# Patient Record
Sex: Female | Born: 1970 | Race: White | Hispanic: No | Marital: Married | Smoking: Never smoker
Health system: Southern US, Community
[De-identification: ages and names within clinical notes are randomized; demographics above are authoritative.]

## PROBLEM LIST (undated history)

## (undated) DIAGNOSIS — Z9889 Other specified postprocedural states: Secondary | ICD-10-CM

## (undated) DIAGNOSIS — N63 Unspecified lump in unspecified breast: Secondary | ICD-10-CM

## (undated) DIAGNOSIS — Z801 Family history of malignant neoplasm of trachea, bronchus and lung: Secondary | ICD-10-CM

## (undated) DIAGNOSIS — C801 Malignant (primary) neoplasm, unspecified: Secondary | ICD-10-CM

## (undated) DIAGNOSIS — R112 Nausea with vomiting, unspecified: Secondary | ICD-10-CM

## (undated) DIAGNOSIS — Z808 Family history of malignant neoplasm of other organs or systems: Secondary | ICD-10-CM

## (undated) DIAGNOSIS — M1990 Unspecified osteoarthritis, unspecified site: Secondary | ICD-10-CM

## (undated) DIAGNOSIS — M5136 Other intervertebral disc degeneration, lumbar region: Secondary | ICD-10-CM

## (undated) DIAGNOSIS — D649 Anemia, unspecified: Secondary | ICD-10-CM

## (undated) DIAGNOSIS — Z8052 Family history of malignant neoplasm of bladder: Secondary | ICD-10-CM

## (undated) HISTORY — DX: Family history of malignant neoplasm of other organs or systems: Z80.8

## (undated) HISTORY — PX: OTHER SURGICAL HISTORY: SHX169

## (undated) HISTORY — DX: Family history of malignant neoplasm of trachea, bronchus and lung: Z80.1

## (undated) HISTORY — PX: TONSILLECTOMY: SUR1361

## (undated) HISTORY — PX: BREAST SURGERY: SHX581

## (undated) HISTORY — DX: Family history of malignant neoplasm of bladder: Z80.52

---

## 2001-04-09 ENCOUNTER — Other Ambulatory Visit: Admission: RE | Admit: 2001-04-09 | Discharge: 2001-04-09 | Payer: Self-pay | Admitting: Gynecology

## 2002-05-31 ENCOUNTER — Other Ambulatory Visit: Admission: RE | Admit: 2002-05-31 | Discharge: 2002-05-31 | Payer: Self-pay | Admitting: Gynecology

## 2004-12-03 ENCOUNTER — Other Ambulatory Visit: Admission: RE | Admit: 2004-12-03 | Discharge: 2004-12-03 | Payer: Self-pay | Admitting: Obstetrics and Gynecology

## 2004-12-06 ENCOUNTER — Encounter: Admission: RE | Admit: 2004-12-06 | Discharge: 2004-12-06 | Payer: Self-pay | Admitting: Obstetrics and Gynecology

## 2008-02-10 ENCOUNTER — Encounter: Admission: RE | Admit: 2008-02-10 | Discharge: 2008-02-10 | Payer: Self-pay | Admitting: Obstetrics and Gynecology

## 2010-12-10 ENCOUNTER — Other Ambulatory Visit: Payer: Self-pay | Admitting: Obstetrics and Gynecology

## 2010-12-10 DIAGNOSIS — N644 Mastodynia: Secondary | ICD-10-CM

## 2014-01-12 ENCOUNTER — Ambulatory Visit: Payer: Self-pay | Admitting: Podiatry

## 2014-01-14 ENCOUNTER — Ambulatory Visit (INDEPENDENT_AMBULATORY_CARE_PROVIDER_SITE_OTHER): Payer: BLUE CROSS/BLUE SHIELD | Admitting: Podiatry

## 2014-01-14 ENCOUNTER — Encounter: Payer: Self-pay | Admitting: Podiatry

## 2014-01-14 ENCOUNTER — Ambulatory Visit (INDEPENDENT_AMBULATORY_CARE_PROVIDER_SITE_OTHER): Payer: BLUE CROSS/BLUE SHIELD

## 2014-01-14 VITALS — BP 106/70 | HR 67 | Resp 18

## 2014-01-14 DIAGNOSIS — M7661 Achilles tendinitis, right leg: Secondary | ICD-10-CM

## 2014-01-14 DIAGNOSIS — M722 Plantar fascial fibromatosis: Secondary | ICD-10-CM

## 2014-01-14 MED ORDER — TRIAMCINOLONE ACETONIDE 10 MG/ML IJ SUSP
10.0000 mg | Freq: Once | INTRAMUSCULAR | Status: AC
  Administered 2014-01-14: 10 mg

## 2014-01-14 MED ORDER — MELOXICAM 7.5 MG PO TABS: 7.5000 mg | ORAL_TABLET | Freq: Every day | ORAL | Status: DC

## 2014-01-14 NOTE — Progress Notes (Signed)
   Subjective:    Patient ID: Sabrina Spencer, female    DOB: September 12, 1970, 44 y.o.   MRN: [id number]  HPI  44 year old female presents to the office for complaints of right heel pain, which has been ongoing for approximately 1 month. She does not relate any recent injury or trauma to the area. She denies any change in activity recently. She has been rolling her foot with a frozen water bottle, which states it helps. She states it hurts mostly in the morning, or after periods of activity and after prolonged standing/activity. Denies any overlying erythema or swelling. Denies any burning or numbness. No other complaints at this time.    Review of Systems  All other systems reviewed and are negative.      Objective:   Physical Exam AAO x3, NAD DP/PT pulses palpable bilaterally, CRT less than 3 seconds Protective sensation intact with Simms Weinstein monofilament, vibratory sensation intact, Achilles tendon reflex intact Tenderness to palpation upon the plantar medial tubercle of the calcaneus at the insertion of the plantar fascia on the right foot. There is no pain along the course of the plantar fascia within the arch of the foot and the plantar fascia appears to be intact. There is no pain with lateral compression of the calcaneus or pain with vibratory sensation. There is mild discomfort along the insertion of the Achilles tendon on the right foot. There is no pain on the midsubstance of the Achilles tendon and the tendon appears to be intact. No other areas of pinpoint bony tenderness or pain with vibratory sensation to bilateral lower extremity's. No overlying edema, erythema, increase in warmth bilaterally. MMT 5/5, ROM WNL No open lesions or pre-ulcerative lesions are identified. No pain with calf compression, swelling, warmth, erythema.       Assessment & Plan:  44 year old female with right heel pain, likely plantar fasciitis; insertional Achilles tendinitis -X-rays were obtained and  reviewed the patient. -Treatment options were discussed including alternatives, risks, complications. -Patient elects to proceed with steroid injection into the right plantar heel. Under sterile skin preparation, a total of 2.5cc of kenalog 10, 0.5% Marcaine plain, and 2% lidocaine plain were infiltrated into the symptomatic area without complication. A band-aid was applied. Patient tolerated the injection well without complication. Post-injection care with discussed with the patient. Discussed with the patient to ice the area over the next couple of days to help prevent a steroid flare.  -Prescribed meloxicam. Discussed side effects the medication and directed to stop immediately should any occur and call the office. -Dispensed plantar fascial brace. -Discussed stretching exercises. -Ice to the area. -Discussed shoe gear modifications and possible orthotics. -Follow-up in 3 weeks or sooner should any problems arise. In the meantime, encouraged all the office with any questions, concerns, change in symptoms.

## 2014-01-14 NOTE — Patient Instructions (Signed)
Plantar Fasciitis (Heel Spur Syndrome) with Rehab The plantar fascia is a fibrous, ligament-like, soft-tissue structure that spans the bottom of the foot. Plantar fasciitis is a condition that causes pain in the foot due to inflammation of the tissue. SYMPTOMS   Pain and tenderness on the underneath side of the foot.  Pain that worsens with standing or walking. CAUSES  Plantar fasciitis is caused by irritation and injury to the plantar fascia on the underneath side of the foot. Common mechanisms of injury include:  Direct trauma to bottom of the foot.  Damage to a small nerve that runs under the foot where the main fascia attaches to the heel bone.  Stress placed on the plantar fascia due to bone spurs. RISK INCREASES WITH:   Activities that place stress on the plantar fascia (running, jumping, pivoting, or cutting).  Poor strength and flexibility.  Improperly fitted shoes.  Tight calf muscles.  Flat feet.  Failure to warm-up properly before activity.  Obesity. PREVENTION  Warm up and stretch properly before activity.  Allow for adequate recovery between workouts.  Maintain physical fitness:  Strength, flexibility, and endurance.  Cardiovascular fitness.  Maintain a health body weight.  Avoid stress on the plantar fascia.  Wear properly fitted shoes, including arch supports for individuals who have flat feet. PROGNOSIS  If treated properly, then the symptoms of plantar fasciitis usually resolve without surgery. However, occasionally surgery is necessary. RELATED COMPLICATIONS   Recurrent symptoms that may result in a chronic condition.  Problems of the lower back that are caused by compensating for the injury, such as limping.  Pain or weakness of the foot during push-off following surgery.  Chronic inflammation, scarring, and partial or complete fascia tear, occurring more often from repeated injections. TREATMENT  Treatment initially involves the use of  ice and medication to help reduce pain and inflammation. The use of strengthening and stretching exercises may help reduce pain with activity, especially stretches of the Achilles tendon. These exercises may be performed at home or with a therapist. Your caregiver may recommend that you use heel cups of arch supports to help reduce stress on the plantar fascia. Occasionally, corticosteroid injections are given to reduce inflammation. If symptoms persist for greater than 6 months despite non-surgical (conservative), then surgery may be recommended.  MEDICATION   If pain medication is necessary, then nonsteroidal anti-inflammatory medications, such as aspirin and ibuprofen, or other minor pain relievers, such as acetaminophen, are often recommended.  Do not take pain medication within 7 days before surgery.  Prescription pain relievers may be given if deemed necessary by your caregiver. Use only as directed and only as much as you need.  Corticosteroid injections may be given by your caregiver. These injections should be reserved for the most serious cases, because they may only be given a certain number of times. HEAT AND COLD  Cold treatment (icing) relieves pain and reduces inflammation. Cold treatment should be applied for 10 to 15 minutes every 2 to 3 hours for inflammation and pain and immediately after any activity that aggravates your symptoms. Use ice packs or massage the area with a piece of ice (ice massage).  Heat treatment may be used prior to performing the stretching and strengthening activities prescribed by your caregiver, physical therapist, or athletic trainer. Use a heat pack or soak the injury in warm water. SEEK IMMEDIATE MEDICAL CARE IF:  Treatment seems to offer no benefit, or the condition worsens.  Any medications produce adverse side effects. EXERCISES RANGE   OF MOTION (ROM) AND STRETCHING EXERCISES - Plantar Fasciitis (Heel Spur Syndrome) These exercises may help you  when beginning to rehabilitate your injury. Your symptoms may resolve with or without further involvement from your physician, physical therapist or athletic trainer. While completing these exercises, remember:   Restoring tissue flexibility helps normal motion to return to the joints. This allows healthier, less painful movement and activity.  An effective stretch should be held for at least 30 seconds.  A stretch should never be painful. You should only feel a gentle lengthening or release in the stretched tissue. RANGE OF MOTION - Toe Extension, Flexion  Sit with your right / left leg crossed over your opposite knee.  Grasp your toes and gently pull them back toward the top of your foot. You should feel a stretch on the bottom of your toes and/or foot.  Hold this stretch for __________ seconds.  Now, gently pull your toes toward the bottom of your foot. You should feel a stretch on the top of your toes and or foot.  Hold this stretch for __________ seconds. Repeat __________ times. Complete this stretch __________ times per day.  RANGE OF MOTION - Ankle Dorsiflexion, Active Assisted  Remove shoes and sit on a chair that is preferably not on a carpeted surface.  Place right / left foot under knee. Extend your opposite leg for support.  Keeping your heel down, slide your right / left foot back toward the chair until you feel a stretch at your ankle or calf. If you do not feel a stretch, slide your bottom forward to the edge of the chair, while still keeping your heel down.  Hold this stretch for __________ seconds. Repeat __________ times. Complete this stretch __________ times per day.  STRETCH - Gastroc, Standing  Place hands on wall.  Extend right / left leg, keeping the front knee somewhat bent.  Slightly point your toes inward on your back foot.  Keeping your right / left heel on the floor and your knee straight, shift your weight toward the wall, not allowing your back to  arch.  You should feel a gentle stretch in the right / left calf. Hold this position for __________ seconds. Repeat __________ times. Complete this stretch __________ times per day. STRETCH - Soleus, Standing  Place hands on wall.  Extend right / left leg, keeping the other knee somewhat bent.  Slightly point your toes inward on your back foot.  Keep your right / left heel on the floor, bend your back knee, and slightly shift your weight over the back leg so that you feel a gentle stretch deep in your back calf.  Hold this position for __________ seconds. Repeat __________ times. Complete this stretch __________ times per day. STRETCH - Gastrocsoleus, Standing  Note: This exercise can place a lot of stress on your foot and ankle. Please complete this exercise only if specifically instructed by your caregiver.   Place the ball of your right / left foot on a step, keeping your other foot firmly on the same step.  Hold on to the wall or a rail for balance.  Slowly lift your other foot, allowing your body weight to press your heel down over the edge of the step.  You should feel a stretch in your right / left calf.  Hold this position for __________ seconds.  Repeat this exercise with a slight bend in your right / left knee. Repeat __________ times. Complete this stretch __________ times per day.    STRENGTHENING EXERCISES - Plantar Fasciitis (Heel Spur Syndrome)  These exercises may help you when beginning to rehabilitate your injury. They may resolve your symptoms with or without further involvement from your physician, physical therapist or athletic trainer. While completing these exercises, remember:   Muscles can gain both the endurance and the strength needed for everyday activities through controlled exercises.  Complete these exercises as instructed by your physician, physical therapist or athletic trainer. Progress the resistance and repetitions only as guided. STRENGTH -  Towel Curls  Sit in a chair positioned on a non-carpeted surface.  Place your foot on a towel, keeping your heel on the floor.  Pull the towel toward your heel by only curling your toes. Keep your heel on the floor.  If instructed by your physician, physical therapist or athletic trainer, add ____________________ at the end of the towel. Repeat __________ times. Complete this exercise __________ times per day. STRENGTH - Ankle Inversion  Secure one end of a rubber exercise band/tubing to a fixed object (table, pole). Loop the other end around your foot just before your toes.  Place your fists between your knees. This will focus your strengthening at your ankle.  Slowly, pull your big toe up and in, making sure the band/tubing is positioned to resist the entire motion.  Hold this position for __________ seconds.  Have your muscles resist the band/tubing as it slowly pulls your foot back to the starting position. Repeat __________ times. Complete this exercises __________ times per day.  Document Released: 12/23/2004 Document Revised: 03/17/2011 Document Reviewed: 04/06/2008 [employer] Patient Information 2015 [employer]. This information is not intended to replace advice given to you by your health care provider. Make sure you discuss any questions you have with your health care provider.  

## 2014-02-10 ENCOUNTER — Ambulatory Visit (INDEPENDENT_AMBULATORY_CARE_PROVIDER_SITE_OTHER): Payer: BLUE CROSS/BLUE SHIELD | Admitting: Podiatry

## 2014-02-10 VITALS — BP 110/74 | HR 71 | Resp 16

## 2014-02-10 DIAGNOSIS — M722 Plantar fascial fibromatosis: Secondary | ICD-10-CM

## 2014-02-13 NOTE — Progress Notes (Signed)
Patient ID: Sabrina Spencer, female   DOB: Jan 18, 1970, 44 y.o.   MRN: [id number]  Subjective: 44 year old female returns the office they for follow-up evaluation of right heel pain, plantar fasciitis. She states that since last appointment she no longer has any discomfort to the heel. She is inquiring she can go back to starting her exercise. She's been continuing the stretching icing exercises.  She is able to ambulate in a regular shoe without any difficulty. Denies any recent swelling or redness overlying the area. No other complaints at this time.  Objective: AAO x3, NAD DP/PT pulses palpable bilaterally, CRT less than 3 seconds Protective sensation intact with Simms Weinstein monofilament, vibratory sensation intact, Achilles tendon reflex intact There is currently no tenderness to palpation overlying the plantar medial tubercle of the calcaneus on the right heel at the insertion of the plantar fascia. There is no pain along the course of plantar fascial within the arch of the foot. There is no pain with lateral compression of the calcaneus or pain the vibratory sensation. No pain on the posterior aspect of the calcaneus or along the course/insertion of the Achilles tendon. There is no overlying edema, erythema, increase in warmth. No other areas of tenderness palpation or pain with vibratory sensation to the foot/ankle bilaterally. MMT 5/5, ROM WNL No open lesions or pre-ulcerative lesions are identified. No pain with calf compression, swelling, warmth, erythema.  Assessment: 44 year old female with resolving right heel pain, likely plantar fasciitis  Plan: -At this time as symptoms appear to be resolved she can start to slowly increase her activity. Discussed with her to continue the stretching icing activities before and after exercising and to slowly go back into her regular routine. Discomfort is are to avoid high-impact activities.  Discussed the importance of wearing supportive shoe gear and  discussed possible orthotics for long-term treatment to help prevent recurrence. Should there be any recurrence of symptoms to call the office. Otherwise, follow-up as needed.

## 2015-02-16 ENCOUNTER — Ambulatory Visit: Payer: Self-pay | Admitting: Podiatry

## 2017-07-24 ENCOUNTER — Other Ambulatory Visit: Payer: Self-pay | Admitting: Obstetrics and Gynecology

## 2017-07-24 DIAGNOSIS — N63 Unspecified lump in unspecified breast: Secondary | ICD-10-CM

## 2017-07-27 ENCOUNTER — Ambulatory Visit
Admission: RE | Admit: 2017-07-27 | Discharge: 2017-07-27 | Disposition: A | Discharge status: Home / Self Care | Payer: BLUE CROSS/BLUE SHIELD | Source: Ambulatory Visit | Attending: Obstetrics and Gynecology | Admitting: Obstetrics and Gynecology

## 2017-07-27 ENCOUNTER — Other Ambulatory Visit: Payer: Self-pay | Admitting: Obstetrics and Gynecology

## 2017-07-27 DIAGNOSIS — N63 Unspecified lump in unspecified breast: Secondary | ICD-10-CM

## 2017-07-27 HISTORY — DX: Unspecified lump in unspecified breast: N63.0

## 2017-07-30 ENCOUNTER — Ambulatory Visit
Admission: RE | Admit: 2017-07-30 | Discharge: 2017-07-30 | Disposition: A | Discharge status: Home / Self Care | Payer: BLUE CROSS/BLUE SHIELD | Source: Ambulatory Visit | Attending: Obstetrics and Gynecology | Admitting: Obstetrics and Gynecology

## 2017-07-30 ENCOUNTER — Other Ambulatory Visit: Payer: Self-pay | Admitting: Obstetrics and Gynecology

## 2017-07-30 DIAGNOSIS — N63 Unspecified lump in unspecified breast: Secondary | ICD-10-CM

## 2017-07-31 ENCOUNTER — Encounter (HOSPITAL_COMMUNITY): Payer: Self-pay | Admitting: Emergency Medicine

## 2017-07-31 ENCOUNTER — Emergency Department (HOSPITAL_COMMUNITY)
Admission: EM | Admit: 2017-07-31 | Discharge: 2017-07-31 | Disposition: A | Discharge status: Home / Self Care | Payer: BLUE CROSS/BLUE SHIELD | Attending: Emergency Medicine | Admitting: Emergency Medicine

## 2017-07-31 ENCOUNTER — Other Ambulatory Visit: Payer: Self-pay

## 2017-07-31 ENCOUNTER — Emergency Department (HOSPITAL_COMMUNITY): Payer: BLUE CROSS/BLUE SHIELD

## 2017-07-31 DIAGNOSIS — R0602 Shortness of breath: Secondary | ICD-10-CM | POA: Insufficient documentation

## 2017-07-31 DIAGNOSIS — R110 Nausea: Secondary | ICD-10-CM | POA: Insufficient documentation

## 2017-07-31 DIAGNOSIS — R61 Generalized hyperhidrosis: Secondary | ICD-10-CM | POA: Diagnosis not present

## 2017-07-31 DIAGNOSIS — C50911 Malignant neoplasm of unspecified site of right female breast: Secondary | ICD-10-CM | POA: Diagnosis not present

## 2017-07-31 DIAGNOSIS — R0789 Other chest pain: Secondary | ICD-10-CM | POA: Diagnosis not present

## 2017-07-31 HISTORY — DX: Malignant (primary) neoplasm, unspecified: C80.1

## 2017-07-31 LAB — CBC WITH DIFFERENTIAL/PLATELET
Abs Immature Granulocytes: 0 10*3/uL (ref 0.0–0.1)
Basophils Absolute: 0.1 10*3/uL (ref 0.0–0.1)
Basophils Relative: 1 %
Eosinophils Absolute: 0.1 10*3/uL (ref 0.0–0.7)
Eosinophils Relative: 2 %
HCT: 43.1 % (ref 36.0–46.0)
Hemoglobin: 14.2 g/dL (ref 12.0–15.0)
Immature Granulocytes: 0 %
Lymphocytes Relative: 36 %
Lymphs Abs: 1.9 10*3/uL (ref 0.7–4.0)
MCH: 32.3 pg (ref 26.0–34.0)
MCHC: 32.9 g/dL (ref 30.0–36.0)
MCV: 98.2 fL (ref 78.0–100.0)
Monocytes Absolute: 0.5 10*3/uL (ref 0.1–1.0)
Monocytes Relative: 10 %
Neutro Abs: 2.7 10*3/uL (ref 1.7–7.7)
Neutrophils Relative %: 51 %
Platelets: 217 10*3/uL (ref 150–400)
RBC: 4.39 MIL/uL (ref 3.87–5.11)
RDW: 12.3 % (ref 11.5–15.5)
WBC: 5.4 10*3/uL (ref 4.0–10.5)

## 2017-07-31 LAB — BASIC METABOLIC PANEL
Anion gap: 9 (ref 5–15)
BUN: 15 mg/dL (ref 6–20)
CO2: 22 mmol/L (ref 22–32)
Calcium: 9.1 mg/dL (ref 8.9–10.3)
Chloride: 108 mmol/L (ref 98–111)
Creatinine, Ser: 0.79 mg/dL (ref 0.44–1.00)
GFR calc Af Amer: >60 mL/min (ref >60)
GFR calc non Af Amer: >60 mL/min (ref >60)
Glucose, Bld: 83 mg/dL (ref 70–99)
Potassium: 4.4 mmol/L (ref 3.5–5.1)
Sodium: 139 mmol/L (ref 135–145)

## 2017-07-31 LAB — D-DIMER, QUANTITATIVE: D-Dimer, Quant: 0.42 ug/mL-FEU (ref 0.00–0.50)

## 2017-07-31 LAB — I-STAT TROPONIN, ED
Troponin i, poc: 0 ng/mL (ref 0.00–0.08)
Troponin i, poc: 0 ng/mL (ref 0.00–0.08)

## 2017-07-31 NOTE — ED Provider Notes (Signed)
[facility] DEPART[facility] Provider Note   CSN: [id number] Arrival date & time: 07/31/17  1435     History   Chief Complaint Chief Complaint  Patient presents with  . Chest Pain    HPI Sabrina Spencer is a 47 y.o. female , who was diagnosed yesterday with cancer of the right breast, who presents to ED for evaluation of 2-week history of intermittent right-sided chest pain radiating to the right neck with nausea, shortness of breath, fatigue and diaphoresis.  States that the entire right side of her body feels different.  She initially began feeling like her breasts were larger.  She noticed that the right breast was significantly larger than the left and when she did a self-exam, she noticed a mass/lump in the area.  Biopsy was done yesterday and she was called today informing that results of cancer.  She had chest pain yesterday and her OB/GYN's office and they insisted on her coming to the ED.  However, patient did not want to because she states that "I just wanted to handle one thing at a time."  They told her that the chest pain was not related to her cancer and that it was cardiac related.  She returns today because it worsened in the past 3 to 4 days.  She is meeting in the next few days to weeks for further management of her newly diagnosed breast cancer.  Reports chronic cough that is nonproductive.  Denies any hemoptysis, history of MI, DVT or PE, leg swelling, recent surgeries, recent prolonged travel, tobacco use, family history of heart disease, fever, abdominal pain, vomiting.  HPI  Past Medical History:  Diagnosis Date  . Breast mass   . Cancer ([place])     There are no active problems to display for this patient.   History reviewed. No pertinent surgical history.   OB History   None      Home Medications    Prior to Admission medications   Medication Sig Start Date End Date Taking? Authorizing Provider  lamoTRIgine (LAMICTAL) 200 MG tablet   12/18/13   [provider]  meloxicam (MOBIC) 7.5 MG tablet Take 1 tablet (7.5 mg total) by mouth daily. Patient not taking: Reported on 02/10/2014 01/14/14   Trula Slade, DPM  risperiDONE (RISPERDAL) 3 MG tablet  12/25/13   [provider]  zaleplon (SONATA) 10 MG capsule  11/18/13   [provider]    Family History History reviewed. No pertinent family history.  Social History Social History   Tobacco Use  . Smoking status: Never Smoker  . Smokeless tobacco: Never Used  Substance Use Topics  . Alcohol use: Yes    Alcohol/week: 0.0 oz    Comment: 4drinks/week  . Drug use: No     Allergies   Patient has no known allergies.   Review of Systems Review of Systems  Constitutional: Positive for diaphoresis. Negative for appetite change, chills and fever.  HENT: Negative for ear pain, rhinorrhea, sneezing and sore throat.   Eyes: Negative for photophobia and visual disturbance.  Respiratory: Positive for shortness of breath. Negative for cough, chest tightness and wheezing.   Cardiovascular: Positive for chest pain. Negative for palpitations.  Gastrointestinal: Positive for nausea. Negative for abdominal pain, blood in stool, constipation, diarrhea and vomiting.  Genitourinary: Negative for dysuria, hematuria and urgency.  Musculoskeletal: Negative for myalgias.  Skin: Negative for rash.  Neurological: Negative for dizziness, weakness and light-headedness.     Physical Exam  Updated Vital Signs BP (!) 109/97   Pulse 71   Temp 98.6 F (37 C) (Oral)   Resp 15   Ht '5\' 2"'$  (1.575 m)   Wt 67.1 kg (148 lb)   SpO2 100%   BMI 27.07 kg/m   Physical Exam  Constitutional: She appears well-developed and well-nourished. No distress.  HENT:  Head: Normocephalic and atraumatic.  Nose: Nose normal.  Eyes: Conjunctivae and EOM are normal. Left eye exhibits no discharge. No scleral icterus.  Neck: Normal range of motion. Neck supple.   Cardiovascular: Normal rate, regular rhythm, normal heart sounds and intact distal pulses. Exam reveals no gallop and no friction rub.  No murmur heard. Pulmonary/Chest: Effort normal and breath sounds normal. No respiratory distress. She exhibits tenderness.    Abdominal: Soft. Bowel sounds are normal. She exhibits no distension. There is no tenderness. There is no guarding.  Musculoskeletal: Normal range of motion. She exhibits no edema.  No lower extremity edema, erythema or calf tenderness bilaterally.  Neurological: She is alert. She exhibits normal muscle tone. Coordination normal.  Skin: Skin is warm and dry. No rash noted.  Psychiatric: She has a normal mood and affect.  Nursing note and vitals reviewed.    ED Treatments / Results  Labs (all labs ordered are listed, but only abnormal results are displayed) Labs Reviewed  CBC WITH DIFFERENTIAL/PLATELET  BASIC METABOLIC PANEL  D-DIMER, QUANTITATIVE (NOT AT [facility])  I-STAT TROPONIN, ED  I-STAT TROPONIN, ED    EKG EKG Interpretation  Date/Time:  Friday July 31 2017 16:30:06 EDT Ventricular Rate:  80 PR Interval:    QRS Duration: 84 QT Interval:  372 QTC Calculation: 430 R Axis:   64 Text Interpretation:  Sinus rhythm No old tracing to compare Confirmed by Duffy Bruce [phone]) on 07/31/2017 5:04:15 PM   Radiology Dg Chest 2 View  Result Date: 07/31/2017 CLINICAL DATA:  Patient status post axillary lymph node biopsy yesterday. Chest pain and shortness of breath since the procedure. EXAM: CHEST - 2 VIEW COMPARISON:  None. FINDINGS: The lungs are clear. Heart size is normal. No pneumothorax or pleural effusion. No bony abnormality. No radiopaque foreign body. IMPRESSION: Normal chest. Electronically Signed   By: Inge Rise M.D.   On: 07/31/2017 16:11   [place] Axillary Node Core Biopsy Right  Result Date: 07/30/2017 CLINICAL DATA:  Ultrasound-guided core needle biopsy was recommended of a right axillary lymph node  with mild/borderline cortical thickening. The patient has a suspicious right breast mass which was biopsied today. EXAM: [place] AXILLARY NODE CORE BIOPSY RIGHT COMPARISON:  Previous exam(s). FINDINGS: I met with the patient and we discussed the procedure of ultrasound-guided biopsy, including benefits and alternatives. We discussed the high likelihood of a successful procedure. We discussed the risks of the procedure, including infection, bleeding, tissue injury, clip migration, and inadequate sampling. Informed written consent was given. The usual time-out protocol was performed immediately prior to the procedure. Using sterile technique and 1% Lidocaine as local anesthetic, under direct ultrasound visualization, a 14 gauge spring-loaded device was used to perform biopsy of an inferior right axillary lymph node using a lateral approach. At the conclusion of the procedure a HydroMARK tissue marker clip was deployed into the biopsy cavity. Follow up 2 view mammogram was performed and dictated separately. IMPRESSION: Ultrasound guided biopsy of right axillary lymph node. No apparent complications. Electronically Signed   By: Curlene Dolphin M.D.   On: 07/30/2017 13:24   Mm Clip Placement Right  Result Date: 07/30/2017  CLINICAL DATA:  Ultrasound-guided biopsies of a right breast mass at 10 o'clock position and of a inferior right axillary lymph node were performed today. EXAM: DIAGNOSTIC RIGHT MAMMOGRAM POST ULTRASOUND BIOPSIES COMPARISON:  Previous exam(s). FINDINGS: Mammographic images were obtained following ultrasound guided biopsy of an upper-outer quadrant right breast mass and of a right axillary lymph node. Ribbon shaped biopsy clip is satisfactorily positioned within the biopsied mass. The mass is obscured on the 2D images by the patient's extremely dense breast parenchyma. HydroMARK biopsy clip projects over the inferior right axilla. IMPRESSION: Satisfactory position of ribbon shaped and HydroMARK biopsy  clips. Final Assessment: Post Procedure Mammograms for Marker Placement Electronically Signed   By: Curlene Dolphin M.D.   On: 07/30/2017 13:26   [place] Rt Breast Bx W Loc Dev 1st Lesion Img Bx Spec US Guide  Result Date: 07/30/2017 CLINICAL DATA:  47 year old patient presents for biopsy a suspicious palpable mass in the upper-outer quadrant of the right breast, 10 o'clock position. While with the patient today in the ultrasound room, she describes to me an approximately 2 week history intermittent shortness of breath, mild chest pain, fatigue, and just not feeling well. She tells me that she exercises regularly, but recently, she is unable to tolerate exercise that would typically be easy for her. EXAM: ULTRASOUND GUIDED LEFT BREAST CORE NEEDLE BIOPSY COMPARISON:  Previous exam(s). FINDINGS: I met with the patient and we discussed the procedure of ultrasound-guided biopsy, including benefits and alternatives. We discussed the high likelihood of a successful procedure. We discussed the risks of the procedure, including infection, bleeding, tissue injury, clip migration, and inadequate sampling. Informed written consent was given. The usual time-out protocol was performed immediately prior to the procedure. Lesion quadrant: Upper outer quadrant Using sterile technique and 1% Lidocaine as local anesthetic, under direct ultrasound visualization, a 12 gauge spring-loaded device was used to perform biopsy of a palpable hypoechoic mass at 10 o'clock position using a lateral approach. At the conclusion of the procedure a ribbon tissue marker clip was deployed into the biopsy cavity. Follow up 2 view mammogram was performed and dictated separately. IMPRESSION: Ultrasound guided biopsy of the right breast. No apparent complications. The patient describes new onset shortness of breath with mild activity, occasional chest pain, fatigue, and not feeling well. We took her blood pressure while she was in the office today, and it  was normal at 102/78. She was encouraged by myself and our nurse to seek evaluation in the emergency department. I spoke with the patient's nurse practitioner at [facility], Laurin Coder, who agrees that the patient should be seen at [facility] Emergency Department. She also asked me to tell the patient to stop her hormone replacement therapy. The patient tells me that she has not been taking it for approximately 1 week. Electronically Signed   By: Curlene Dolphin M.D.   On: 07/30/2017 13:24    Procedures Procedures (including critical care time)  Medications Ordered in ED Medications - No data to display   Initial Impression / Assessment and Plan / ED Course  I have reviewed the triage vital signs and the nursing notes.  Pertinent labs & imaging results that were available during my care of the patient were reviewed by me and considered in my medical decision making (see chart for details).     47 year old female that was recently diagnosed with cancer of right breast, presents for evaluation of intermittent right-sided chest pain radiating to her neck, shortness of breath, diaphoresis, nausea intermittently  for the past 2 weeks.  She was sent here by her OB/GYN to evaluate for cardiac etiology.  She was diagnosed with breast cancer today after biopsy was completed yesterday.  She initially attributed the symptoms to her cancer.  Denies any history of MI, PE, DVT, leg swelling.  On physical exam she is overall well-appearing.  Tenderness to palpation of the left side of the chest and reports "soreness" on the right side.  There is no lower extremity edema, erythema or calf tenderness.  She is not tachycardic, tachypneic, hypoxic or febrile.  Initial and delta troponin, d-dimer, CBC, BMP all unremarkable.  Chest x-ray is negative for acute abnormality including mass.  EKG shows normal sinus rhythm.  I educated patient on her unremarkable lab work and reassuring vital signs here.  Her symptoms could be due  to her recently diagnosed cancer.  Patient is comfortable with discharging home and following up with her OB/GYN, general surgeon and primary care provider.  I have low suspicion for cardiac or pulmonary etiology of her symptoms.  Advised to return to ED for any severe worsening symptoms.  Portions of this note were generated with [employer]. Dictation errors may occur despite best attempts at proofreading.   Final Clinical Impressions(s) / ED Diagnoses   Final diagnoses:  Chest wall pain    ED Discharge Orders    None       Delia Heady, PA-C 07/31/17 1916    Duffy Bruce, MD 08/03/17 [phone]

## 2017-07-31 NOTE — ED Provider Notes (Signed)
Patient placed in Quick Look pathway, seen and evaluated   Chief Complaint: Chest pain, shortness of breath  HPI: Patient with complaint of episodic chest pain, shortness of breath in setting of recent diagnosis of breast cancer.  Patient reports intermittent episodes of right-sided chest pain with radiation to her right neck.  No history of blood clots.  She reports pain in her right ankle and right knee but not in the calf or thigh.  No swelling of the leg.  No anticoagulation.  ROS:  Positive ROS: (+) Chest pain, shortness of breath, sweating Negative ROS: (-) Fever, cough, leg swelling  Physical Exam:   Gen: No distress  Neuro: Awake and Alert  Skin: Warm    Focused Exam: Heart RRR, nml S1,S2, no m/r/g; Lungs CTAB; Abd soft, NT, no rebound or guarding; Ext 2+ pedal pulses bilaterally, no edema.  BP 117/87 (BP Location: Right Arm)   Pulse 88   Temp 98.6 F (37 C) (Oral)   Resp 16   Ht 5\' 2"  (1.575 m)   Wt 67.1 kg (148 lb)   SpO2 98%   BMI 27.07 kg/m   Plan: Cardiac work-up plus d-dimer given the risk factors for blood clot.  Initiation of care has begun. The patient has been counseled on the process, plan, and necessity for staying for the completion/evaluation, and the remainder of the medical screening examination    Carlisle Cater, Hershal Coria 07/31/17 1503    Noemi Chapel, MD 08/01/17 2134

## 2017-07-31 NOTE — Discharge Instructions (Signed)
Return to ED if he starts to develop worsening chest pain, coughing or vomiting up blood, severe shortness of breath with walking short distances, swelling of your legs, lightheadedness or loss of consciousness.

## 2017-07-31 NOTE — ED Triage Notes (Signed)
Pt arrives via POV from home with substernal CP off and on for the last week. Pt c/o fatigue, nausea, headaches, diaphoresis with pain radiating to right neck. Pain usually lasts 55mins, improves on it own. Recently diagnosed with breast CA. Sent here for further eval. Denies heart hx. VSS.

## 2017-07-31 NOTE — ED Notes (Signed)
Discharge instructions discussed with Pt. Pt verbalized understanding. Pt stable and ambulatory.    

## 2017-07-31 NOTE — ED Notes (Signed)
Pt currently denies any CP at rest.

## 2017-08-04 ENCOUNTER — Other Ambulatory Visit: Payer: Self-pay | Admitting: Obstetrics and Gynecology

## 2017-08-04 DIAGNOSIS — N6320 Unspecified lump in the left breast, unspecified quadrant: Secondary | ICD-10-CM

## 2017-08-04 DIAGNOSIS — N644 Mastodynia: Secondary | ICD-10-CM

## 2017-08-06 ENCOUNTER — Encounter: Payer: Self-pay | Admitting: Radiation Oncology

## 2017-08-07 ENCOUNTER — Other Ambulatory Visit: Payer: BLUE CROSS/BLUE SHIELD

## 2017-08-10 ENCOUNTER — Telehealth: Payer: Self-pay | Admitting: Hematology and Oncology

## 2017-08-10 ENCOUNTER — Other Ambulatory Visit: Payer: Self-pay | Admitting: General Surgery

## 2017-08-10 DIAGNOSIS — C50419 Malignant neoplasm of upper-outer quadrant of unspecified female breast: Secondary | ICD-10-CM

## 2017-08-10 NOTE — Progress Notes (Signed)
Location of Breast Cancer: upper outer right breast  Histology per Pathology Report: 1. 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  Receptor Status: ER(60%), PR (60%), Her2-neu (negative), Ki-(5%)  Did patient present with symptoms (if so, please note symptoms) or was this found on screening mammography?: CLINICAL DATA:  Large mass in the upper outer right breast felt by the patient for the past 2 weeks.  Past/Anticipated interventions by surgeon, if any: Per Dr. Dalbert Batman 08/06/17:  Referral to Med Onc and Rad Onc with bilateral breast MRI. F/u with Dr. Dalbert Batman after those are completed.  Past/Anticipated interventions by medical oncology, if any: Chemotherapy: To be seen by Dr. Lindi Adie 08/12/17  Lymphedema issues, if any:  No, pt has not had any surgical procedures at this time.  Pain issues, if any:  No  SAFETY ISSUES:  Prior radiation? No  Pacemaker/ICD? No  Possible current pregnancy? No  Is the patient on methotrexate? No   Current Complaints / other details:  Pt presents for consult for Radiation Oncology with Dr. Sondra Come. Pt is anxious about future treatment plans and financial ramifications. Pt scored a "7" on distress screening and SW consult was ordered per protocol. Pt is unaccompanied today.     Loma Sousa, RN 08/12/2017,2:12 PM

## 2017-08-10 NOTE — Telephone Encounter (Signed)
New referral received from Dr. Dalbert Batman for breast cancer. Pt has been scheduled to see Dr. Lindi Adie on 8/7 at 11am. Pt aware to arrive 30 minutes early.

## 2017-08-11 ENCOUNTER — Ambulatory Visit
Admission: RE | Admit: 2017-08-11 | Discharge: 2017-08-11 | Disposition: A | Discharge status: Home / Self Care | Payer: BLUE CROSS/BLUE SHIELD | Source: Ambulatory Visit | Attending: General Surgery | Admitting: General Surgery

## 2017-08-11 ENCOUNTER — Other Ambulatory Visit: Payer: BLUE CROSS/BLUE SHIELD

## 2017-08-11 DIAGNOSIS — C50419 Malignant neoplasm of upper-outer quadrant of unspecified female breast: Secondary | ICD-10-CM

## 2017-08-11 MED ORDER — GADOBENATE DIMEGLUMINE 529 MG/ML IV SOLN
14.0000 mL | Freq: Once | INTRAVENOUS | Status: AC | PRN
  Administered 2017-08-11: 14 mL via INTRAVENOUS

## 2017-08-12 ENCOUNTER — Telehealth: Payer: Self-pay | Admitting: Hematology and Oncology

## 2017-08-12 ENCOUNTER — Other Ambulatory Visit: Payer: Self-pay

## 2017-08-12 ENCOUNTER — Encounter: Payer: Self-pay | Admitting: *Deleted

## 2017-08-12 ENCOUNTER — Ambulatory Visit
Admission: RE | Admit: 2017-08-12 | Discharge: 2017-08-12 | Disposition: A | Discharge status: Home / Self Care | Payer: BLUE CROSS/BLUE SHIELD | Source: Ambulatory Visit | Attending: Radiation Oncology | Admitting: Radiation Oncology

## 2017-08-12 ENCOUNTER — Encounter: Payer: Self-pay | Admitting: Radiation Oncology

## 2017-08-12 ENCOUNTER — Inpatient Hospital Stay: Payer: BLUE CROSS/BLUE SHIELD | Attending: Hematology and Oncology | Admitting: Hematology and Oncology

## 2017-08-12 VITALS — BP 117/78 | HR 80 | Temp 98.3°F | Resp 20 | Ht 62.0 in | Wt 150.2 lb

## 2017-08-12 DIAGNOSIS — Z79899 Other long term (current) drug therapy: Secondary | ICD-10-CM | POA: Diagnosis not present

## 2017-08-12 DIAGNOSIS — Z170 Estrogen receptor positive status [ER+]: Secondary | ICD-10-CM

## 2017-08-12 DIAGNOSIS — C50411 Malignant neoplasm of upper-outer quadrant of right female breast: Secondary | ICD-10-CM

## 2017-08-12 DIAGNOSIS — C50011 Malignant neoplasm of nipple and areola, right female breast: Secondary | ICD-10-CM | POA: Diagnosis not present

## 2017-08-12 DIAGNOSIS — R918 Other nonspecific abnormal finding of lung field: Secondary | ICD-10-CM | POA: Insufficient documentation

## 2017-08-12 MED ORDER — LETROZOLE 2.5 MG PO TABS: 2.5000 mg | ORAL_TABLET | Freq: Every day | ORAL | 3 refills | Status: DC

## 2017-08-12 NOTE — Assessment & Plan Note (Signed)
07/30/2017: Palpable nodules in the right breast: 10 o'clock position: U/S 4.1 cm mass, single abnormal lymph node: Biopsy benign, biopsy of the mass revealed IDC with micropapillary features and extravasated mucin, grade 2, EF 60%, PR 60%, Ki-67 5%, HER-2 negative ratio 1.32, MRI revealed additional satellite nodules together measured 7.5 cm, T3 N0 stage II clinical stage AJCC 8  Pathology and radiology counseling: Discussed with the patient, the details of pathology including the type of breast cancer,the clinical staging, the significance of ER, PR and HER-2/neu receptors and the implications for treatment. After reviewing the pathology in detail, we proceeded to discuss the different treatment options between surgery, radiation, chemotherapy, antiestrogen therapies.  Recommendation: 1.  Right mastectomy with sentinel lymph node biopsy 2. breast reconstruction  3.  Oncotype DX testing to determine if she needs chemotherapy  4.  Adjuvant radiation therapy 5  followed by adjuvant antiestrogen therapy with letrozole 2.5 mg daily X 7 years  because it could take at least a few weeks for her to get surgery, I recommended starting letrozole today.  I sent a prescription for letrozole to her pharmacy.  I discussed risks and benefits of letrozole.  She will stop it to 3 days prior to her surgery.  Return to clinic after surgery to discuss the pathology report

## 2017-08-12 NOTE — Progress Notes (Signed)
[facility] CONSULT NOTE  Patient Care Team: Patient, No Pcp Per as PCP - General (General Practice)  CHIEF COMPLAINTS/PURPOSE OF CONSULTATION:  Newly diagnosed breast cancer  HISTORY OF PRESENTING ILLNESS:  Sabrina Spencer 47 y.o. female is here because of recent diagnosis of right breast cancer.  Patient felt a lump in the right breast.  Mammogram and ultrasound revealed a 4.1 cm mass with a single abnormal lymph node.  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  There were additional satellite nodules noted on the breast MRI.  The total area of abnormality measures 7.5 cm on MRI.  She was seen by Dr. Dalbert Batman who sent the patient was for discussion regarding treatment options.  She is accompanied by her husband.  She works as a Network engineer to Soil scientist.  I reviewed her records extensively and collaborated the history with the patient.  SUMMARY OF ONCOLOGIC HISTORY:   Malignant neoplasm of upper-outer quadrant of right breast in female, estrogen receptor positive ([place])   07/30/2017 Initial Diagnosis    Palpable nodules in the right breast: 10 o'clock position: U/S 4.1 cm mass, single abnormal lymph node: Biopsy benign, biopsy of the mass revealed IDC with micropapillary features and extravasated mucin, grade 2, EF 60%, PR 60%, Ki-67 5%, HER-2 negative ratio 1.32, MRI revealed additional satellite nodules together measured 7.5 cm, T3 N0 stage II clinical stage AJCC 8       MEDICAL HISTORY:  Past Medical History:  Diagnosis Date  . Breast mass   . Cancer [facility])     SURGICAL HISTORY: No past surgical history on file.  SOCIAL HISTORY: Social History   Socioeconomic History  . Marital status: Married    Spouse name: Not on file  . Number of children: Not on file  . Years of education: Not on file  . Highest education level: Not on file  Occupational History  . Not on file  Social Needs  . Financial  resource strain: Not on file  . Food insecurity:    Worry: Not on file    Inability: Not on file  . Transportation needs:    Medical: Not on file    Non-medical: Not on file  Tobacco Use  . Smoking status: Never Smoker  . Smokeless tobacco: Never Used  Substance and Sexual Activity  . Alcohol use: Yes    Alcohol/week: 0.0 oz    Comment: 4drinks/week  . Drug use: No  . Sexual activity: Yes    Birth control/protection: None  Lifestyle  . Physical activity:    Days per week: Not on file    Minutes per session: Not on file  . Stress: Not on file  Relationships  . Social connections:    Talks on phone: Not on file    Gets together: Not on file    Attends religious service: Not on file    Active member of club or organization: Not on file    Attends meetings of clubs or organizations: Not on file    Relationship status: Not on file  . Intimate partner violence:    Fear of current or ex partner: Not on file    Emotionally abused: Not on file    Physically abused: Not on file    Forced sexual activity: Not on file  Other Topics Concern  . Not on file  Social History Narrative  . Not on file  FAMILY HISTORY: No family history on file.  ALLERGIES:  has No Known Allergies.  MEDICATIONS:  Current Outpatient Medications  Medication Sig Dispense Refill  . lamoTRIgine (LAMICTAL) 200 MG tablet   7  . letrozole (FEMARA) 2.5 MG tablet Take 1 tablet (2.5 mg total) by mouth daily. 90 tablet 3  . meloxicam (MOBIC) 7.5 MG tablet Take 1 tablet (7.5 mg total) by mouth daily. (Patient not taking: Reported on 02/10/2014) 30 tablet 0  . risperiDONE (RISPERDAL) 3 MG tablet   7  . zaleplon (SONATA) 10 MG capsule   4   No current facility-administered medications for this visit.     REVIEW OF SYSTEMS:   Constitutional: Denies fevers, chills or abnormal night sweats Eyes: Denies blurriness of vision, double vision or watery eyes Ears, nose, mouth, throat, and face: Denies mucositis or  sore throat Respiratory: Denies cough, dyspnea or wheezes Cardiovascular: Denies palpitation, chest discomfort or lower extremity swelling Gastrointestinal:  Denies nausea, heartburn or change in bowel habits Skin: Denies abnormal skin rashes Lymphatics: Denies new lymphadenopathy or easy bruising Neurological:Denies numbness, tingling or new weaknesses Behavioral/Psych: Mood is stable, no new changes  Breast: Palpable lumps in the right breast. All other systems were reviewed with the patient and are negative.  PHYSICAL EXAMINATION: ECOG PERFORMANCE STATUS: 1 - Symptomatic but completely ambulatory  Vitals:   08/12/17 1051  BP: 111/79  Pulse: 78  Resp: 18  Temp: 98.2 F (36.8 C)  SpO2: 99%   Filed Weights   08/12/17 1051  Weight: 150 lb 3.2 oz (68.1 kg)    GENERAL:alert, no distress and comfortable SKIN: skin color, texture, turgor are normal, no rashes or significant lesions EYES: normal, conjunctiva are pink and non-injected, sclera clear OROPHARYNX:no exudate, no erythema and lips, buccal mucosa, and tongue normal  NECK: supple, thyroid normal size, non-tender, without nodularity LYMPH:  no palpable lymphadenopathy in the cervical, axillary or inguinal LUNGS: clear to auscultation and percussion with normal breathing effort HEART: regular rate & rhythm and no murmurs and no lower extremity edema ABDOMEN:abdomen soft, non-tender and normal bowel sounds Musculoskeletal:no cyanosis of digits and no clubbing  PSYCH: alert & oriented x 3 with fluent speech NEURO: no focal motor/sensory deficits   LABORATORY DATA:  I have reviewed the data as listed Lab Results  Component Value Date   WBC 5.4 07/31/2017   HGB 14.2 07/31/2017   HCT 43.1 07/31/2017   MCV 98.2 07/31/2017   PLT 217 07/31/2017   Lab Results  Component Value Date   NA 139 07/31/2017   K 4.4 07/31/2017   CL 108 07/31/2017   CO2 22 07/31/2017    RADIOGRAPHIC STUDIES: I have personally reviewed the  radiological reports and agreed with the findings in the report.  ASSESSMENT AND PLAN:  Malignant neoplasm of upper-outer quadrant of right breast in female, estrogen receptor positive (Sabrina Spencer) 07/30/2017: Palpable nodules in the right breast: 10 o'clock position: U/S 4.1 cm mass, single abnormal lymph node: Biopsy benign, biopsy of the mass revealed IDC with micropapillary features and extravasated mucin, grade 2, EF 60%, PR 60%, Ki-67 5%, HER-2 negative ratio 1.32, MRI revealed additional satellite nodules together measured 7.5 cm, T3 N0 stage II clinical stage AJCC 8  Pathology and radiology counseling: Discussed with the patient, the details of pathology including the type of breast cancer,the clinical staging, the significance of ER, PR and HER-2/neu receptors and the implications for treatment. After reviewing the pathology in detail, we proceeded to discuss the different treatment options  between surgery, radiation, chemotherapy, antiestrogen therapies.  Recommendation: 1.  Right mastectomy with sentinel lymph node biopsy 2. breast reconstruction  3.  Oncotype DX testing to determine if she needs chemotherapy  4.  Adjuvant radiation therapy 5  followed by adjuvant antiestrogen therapy with letrozole 2.5 mg daily X 7 years  because it could take at least a few weeks for her to get surgery, I recommended starting letrozole today.  I sent a prescription for letrozole to her pharmacy.  I discussed risks and benefits of letrozole.  She will stop it to 3 days prior to her surgery.  Return to clinic after surgery to discuss the pathology report   All questions were answered. The patient knows to call the clinic with any problems, questions or concerns.    Harriette Ohara, MD 08/12/17

## 2017-08-12 NOTE — Progress Notes (Signed)
Radiation Oncology         ([phone]) [phone] ________________________________  Initial Outpatient Consultation  Name: Sabrina Spencer MRN: [id number]  Date: 08/12/2017  DOB: 15-Aug-1970  ZJ:IRCVELF, No Pcp Per  Fanny Skates, MD   REFERRING PHYSICIAN: Fanny Skates, MD  DIAGNOSIS: 47 year-old woman with invasive ductal carcinoma of the right breast, with micropapillary features. ER/PR positive. Clinical stage T3N0.   The encounter diagnosis was Malignant neoplasm of upper-outer quadrant of right breast in female, estrogen receptor positive ([place]).    Malignant neoplasm of upper-outer quadrant of right breast in female, estrogen receptor positive ([place])   07/30/2017 Initial Diagnosis    Palpable nodules in the right breast: 10 o'clock position: U/S 4.1 cm mass, single abnormal lymph node: Biopsy benign, biopsy of the mass revealed IDC with micropapillary features and extravasated mucin, grade 2, EF 60%, PR 60%, Ki-67 5%, HER-2 negative ratio 1.32, MRI revealed additional satellite nodules together measured 7.5 cm, T3 N0 stage II clinical stage AJCC 8       HISTORY OF PRESENT ILLNESS::Sabrina Spencer is a 47 y.o. female who presented for diagnostic mammogram and breast ultrasound on 07/27/2017 after self-palpating a large mass in the upper outer right breast x2 weeks. These scans revealed a 4.1 cm palpable mass in the 10 o'clock position of the right breast with imaging features highly suspicious for malignancy. As well as a single abnormal appearing right axillary lymph node suspicious for a metastatic lymph node. No evidence of malignancy on the left.  Biopsy of the right breast at 10 o'clock position on 07/30/2017 revealed invasive ductal carcinoma with micropapillary features and extravasated mucin, Grade II. ER 60% / PR 60% / Ki-67 5% / HER-2 negative. Right axillary inferior lymph node sampled was negative for carcinoma. 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  Breast MRI on 08/11/2017 shows 4 x 4.5 x 4.5 cm biopsy-proven malignancy within the upper-outer right breast with irregular nodular non masslike enhancement extends anteriorly and inferiorly from this biopsy-proven cancer highly suspicious for malignancy as well. The entire suspicious area, including the biopsy-proven cancer, measures 4.5 x 7.5 x 5.5 cm. If breast conservation is desired, consider tissue sampling of the anterior most suspicious non masslike enhancement within the outer retroareolar right breast. No MR evidence of left breast malignancy or suspicious lymph nodes.  In addition, the patient presented to the ED on 07/31/2017 complaining of 2-week history of intermittent right-sided chest pain radiating to the right neck with nausea, shortness of breath, fatigue and diaphoresis. EKG and lab work were unremarkable and patient was discharged with low suspicion for cardiac or pulmonary etiology of her symptoms.  She denies family history of breast, ovarian, or prostate cancer.  The patient met with Dr. Lindi Adie for consultation earlier today and was prescribed letrozole.  The patient is here for further evaluation and discussion of radiation treatment options in the management of her disease.  PREVIOUS RADIATION THERAPY: No  PAST MEDICAL HISTORY:  has a past medical history of Breast mass and Cancer ([place]).    PAST SURGICAL HISTORY:History reviewed. No pertinent surgical history.  FAMILY HISTORY: family history is not on file.  SOCIAL HISTORY:  reports that she has never smoked. She has never used smokeless tobacco. She reports that she drinks alcohol. She reports that she does not use drugs.  ALLERGIES: Patient has no known allergies.  MEDICATIONS:  Current Outpatient Medications  Medication Sig Dispense Refill  . lamoTRIgine (LAMICTAL) 200  MG tablet   7  . letrozole (FEMARA) 2.5 MG tablet Take 1 tablet (2.5 mg total) by mouth daily. (Patient  not taking: Reported on 08/12/2017) 90 tablet 3  . meloxicam (MOBIC) 7.5 MG tablet Take 1 tablet (7.5 mg total) by mouth daily. (Patient not taking: Reported on 02/10/2014) 30 tablet 0  . risperiDONE (RISPERDAL) 3 MG tablet   7  . zaleplon (SONATA) 10 MG capsule   4   No current facility-administered medications for this encounter.     REVIEW OF SYSTEMS:  On review of systems, the patient reports that she is doing well overall. She denies any pain issues. She denies nipple discharge or bleeding. She denies any upper extremity swelling. She denies any shoulder injuries or surgeries. She denies visual changes. She reports occasional headaches but nothing of concern. She is anxious about future treatment plans and financial ramifications. Patient scored a "7" on distress screening and SW consult was ordered per protocol. A complete review of systems is obtained and is otherwise negative. REVIEW OF SYSTEMS: A 10+ POINT REVIEW OF SYSTEMS WAS OBTAINED including neurology, dermatology, psychiatry, cardiac, respiratory, lymph, extremities, GI, GU, musculoskeletal, constitutional, reproductive, HEENT. All pertinent positives are noted in the HPI. All others are negative.    PHYSICAL EXAM:  height is '5\' 2"'$  (1.575 m) and weight is 150 lb 3.2 oz (68.1 kg). Her oral temperature is 98.3 F (36.8 C). Her blood pressure is 117/78 and her pulse is 80. Her respiration is 20 and oxygen saturation is 100%.   General: Alert and oriented, in no acute distress. She is unaccompanied today. HEENT: Head is normocephalic. Extraocular movements are intact. Oropharynx is clear. Neck: Neck is supple, no palpable cervical or supraclavicular lymphadenopathy. Heart: Regular in rate and rhythm with no murmurs, rubs, or gallops. Chest: Clear to auscultation bilaterally, with no rhonchi, wheezes, or rales. Abdomen: Soft, nontender, nondistended, with no rigidity or guarding. Extremities: No cyanosis or edema. Lymphatics: see Neck  Exam Skin: No concerning lesions. Musculoskeletal: symmetric strength and muscle tone throughout. Neurologic: Cranial nerves II through XII are grossly intact. No obvious focalities. Speech is fluent. Coordination is intact. Psychiatric: Judgment and insight are intact. Affect is appropriate. Breast: Left breast no palpable mass, nipple discharge or bleeding. Right breast has some bruising in the UOQ from biopsy. She has a palpable area of induration in the UOQ measuring approximately 4 x 4 cm consistent with malignancy. No palpable lymphadenopathy in either axilla.   ECOG = 1  0 - Asymptomatic (Fully active, able to carry on all predisease activities without restriction)  1 - Symptomatic but completely ambulatory (Restricted in physically strenuous activity but ambulatory and able to carry out work of a light or sedentary nature. For example, light housework, office work)  2 - Symptomatic, <50% in bed during the day (Ambulatory and capable of all self care but unable to carry out any work activities. Up and about more than 50% of waking hours)  3 - Symptomatic, >50% in bed, but not bedbound (Capable of only limited self-care, confined to bed or chair 50% or more of waking hours)  4 - Bedbound (Completely disabled. Cannot carry on any self-care. Totally confined to bed or chair)  5 - Death   Eustace Pen MM, Creech RH, Tormey DC, et al. [phone]). "Toxicity and response criteria of the [facility]". [place] Oncol. 5 (6): 649-55  LABORATORY DATA:  Lab Results  Component Value Date   WBC 5.4 07/31/2017  HGB 14.2 07/31/2017   HCT 43.1 07/31/2017   MCV 98.2 07/31/2017   PLT 217 07/31/2017   NEUTROABS 2.7 07/31/2017   Lab Results  Component Value Date   NA 139 07/31/2017   K 4.4 07/31/2017   CL 108 07/31/2017   CO2 22 07/31/2017   GLUCOSE 83 07/31/2017   CREATININE 0.79 07/31/2017   CALCIUM 9.1 07/31/2017      RADIOGRAPHY: Dg Chest 2 View  Result Date:  07/31/2017 CLINICAL DATA:  Patient status post axillary lymph node biopsy yesterday. Chest pain and shortness of breath since the procedure. EXAM: CHEST - 2 VIEW COMPARISON:  None. FINDINGS: The lungs are clear. Heart size is normal. No pneumothorax or pleural effusion. No bony abnormality. No radiopaque foreign body. IMPRESSION: Normal chest. Electronically Signed   By: Inge Rise M.D.   On: 07/31/2017 16:11   Mr Breast Bilateral W Wo Contrast Inc Cad  Result Date: 08/11/2017 CLINICAL DATA:  47 year old female with newly diagnosed invasive ductal carcinoma of the UPPER-OUTER RIGHT breast. LABS:  None performed today EXAM: BILATERAL BREAST MRI WITH AND WITHOUT CONTRAST TECHNIQUE: Multiplanar, multisequence MR images of both breasts were obtained prior to and following the intravenous administration of 14 ml of MultiHance. THREE-DIMENSIONAL MR IMAGE RENDERING ON INDEPENDENT WORKSTATION: Three-dimensional MR images were rendered by post-processing of the original MR data on an independent workstation. The three-dimensional MR images were interpreted, and findings are reported in the following complete MRI report for this study. Three dimensional images were evaluated at the independent DynaCad workstation COMPARISON:  Prior mammograms and ultrasounds FINDINGS: Breast composition: c. Heterogeneous fibroglandular tissue. Background parenchymal enhancement: Mild Right breast: A 4 x 4.5 x 4.5 cm confluent mass within the UPPER OUTER RIGHT breast (junction of the middle and posterior thirds) is noted containing biopsy clip artifact compatible with biopsy-proven malignancy. There is irregular nodular nonmasslike enhancement extending anteriorly (to the retroareolar region) and inferiorly from the biopsy-proven cancer likely representing malignancy as well. The entire area of biopsy-proven malignancy and irregular non masslike enhancement measures 4.5 x 7.5 x 5.5 cm (transverse x AP x CC). Scattered foci throughout  the RIGHT breast are also identified but have a similar appearance to the LEFT side. Left breast: No mass or suspicious enhancement. Scattered foci identified. Lymph nodes: No abnormal appearing lymph nodes. Ancillary findings:  None. IMPRESSION: 1. 4 x 4.5 x 4.5 cm biopsy-proven malignancy within the UPPER-OUTER RIGHT breast. Irregular nodular non masslike enhancement extends anteriorly and inferiorly from this biopsy-proven cancer highly suspicious for malignancy as well. The entire suspicious area, including the biopsy-proven cancer, measures 4.5 x 7.5 x 5.5 cm. If breast conservation is desired, consider tissue sampling of the anterior most suspicious non masslike enhancement within the outer retroareolar RIGHT breast. 2. No MR evidence of LEFT breast malignancy or suspicious lymph nodes. RECOMMENDATION: Treatment plan. If breast conservation is desired, consider tissue sampling of the anterior most suspicious non masslike enhancement within the anterior OUTER RIGHT breast. BI-RADS CATEGORY  6: Known biopsy-proven malignancy. Electronically Signed   By: Margarette Canada M.D.   On: 08/11/2017 16:30   US Breast Ltd Uni Right Inc Axilla  Result Date: 07/27/2017 CLINICAL DATA:  Large mass in the upper outer right breast felt by the patient for the past 2 weeks. EXAM: DIGITAL DIAGNOSTIC BILATERAL MAMMOGRAM WITH CAD AND TOMO ULTRASOUND RIGHT BREAST COMPARISON:  Previous exam(s). ACR Breast Density Category d: The breast tissue is extremely dense, which lowers the sensitivity of mammography. FINDINGS: There is subtle  distortion within the extremely dense fibroglandular tissue in the upper outer right breast. There are no other findings suspicious for malignancy in either breast. Mammographic images were processed with CAD. On physical exam, the patient has an approximately 5 cm rounded, firm palpable mass in the upper outer right breast. There are no palpable right axillary lymph nodes. Targeted ultrasound is performed,  showing an irregular, poorly defined, hypoechoic mass with posterior acoustical shadowing in the 10 o'clock position of the right breast, 6 cm from the nipple. This is heterogeneous with peripheral ill-defined increased echogenicity and measures approximately 4.1 x 3.7 x 2.8 cm in maximum dimensions. Ultrasound of the right axilla demonstrates a single abnormal appearing inferior right axillary lymph node with eccentric and focal cortical thickening measuring 5.0 mm in maximum thickness. IMPRESSION: 1. 4.1 cm palpable mass in the 10 o'clock position of the right breast with imaging features highly suspicious for malignancy. 2. Single abnormal appearing right axillary lymph node suspicious for a metastatic lymph node. 3. No evidence of malignancy on the left. RECOMMENDATION: Ultrasound-guided core needle biopsy of the 4.1 cm mass in the 10 o'clock position of the right breast and ultrasound-guided core needle biopsy of the abnormal appearing right axillary lymph node. This has been discussed with the patient and her husband and scheduled at 7:30 a.m. on 07/30/2017. I have discussed the findings and recommendations with the patient. Results were also provided in writing at the conclusion of the visit. If applicable, a reminder letter will be sent to the patient regarding the next appointment. BI-RADS CATEGORY  5: Highly suggestive of malignancy. Electronically Signed   By: Claudie Revering M.D.   On: 07/27/2017 16:53   Mm Diag Breast Tomo Bilateral  Result Date: 07/27/2017 CLINICAL DATA:  Large mass in the upper outer right breast felt by the patient for the past 2 weeks. EXAM: DIGITAL DIAGNOSTIC BILATERAL MAMMOGRAM WITH CAD AND TOMO ULTRASOUND RIGHT BREAST COMPARISON:  Previous exam(s). ACR Breast Density Category d: The breast tissue is extremely dense, which lowers the sensitivity of mammography. FINDINGS: There is subtle distortion within the extremely dense fibroglandular tissue in the upper outer right breast.  There are no other findings suspicious for malignancy in either breast. Mammographic images were processed with CAD. On physical exam, the patient has an approximately 5 cm rounded, firm palpable mass in the upper outer right breast. There are no palpable right axillary lymph nodes. Targeted ultrasound is performed, showing an irregular, poorly defined, hypoechoic mass with posterior acoustical shadowing in the 10 o'clock position of the right breast, 6 cm from the nipple. This is heterogeneous with peripheral ill-defined increased echogenicity and measures approximately 4.1 x 3.7 x 2.8 cm in maximum dimensions. Ultrasound of the right axilla demonstrates a single abnormal appearing inferior right axillary lymph node with eccentric and focal cortical thickening measuring 5.0 mm in maximum thickness. IMPRESSION: 1. 4.1 cm palpable mass in the 10 o'clock position of the right breast with imaging features highly suspicious for malignancy. 2. Single abnormal appearing right axillary lymph node suspicious for a metastatic lymph node. 3. No evidence of malignancy on the left. RECOMMENDATION: Ultrasound-guided core needle biopsy of the 4.1 cm mass in the 10 o'clock position of the right breast and ultrasound-guided core needle biopsy of the abnormal appearing right axillary lymph node. This has been discussed with the patient and her husband and scheduled at 7:30 a.m. on 07/30/2017. I have discussed the findings and recommendations with the patient. Results were also provided in writing at  the conclusion of the visit. If applicable, a reminder letter will be sent to the patient regarding the next appointment. BI-RADS CATEGORY  5: Highly suggestive of malignancy. Electronically Signed   By: Claudie Revering M.D.   On: 07/27/2017 16:53   [place] Axillary Node Core Biopsy Right  Addendum Date: 08/03/2017   ADDENDUM REPORT: 07/31/2017 14:28 ADDENDUM: Pathology revealed GRADE II INVASIVE DUCTAL CARCINOMA WITH MICROPAPILLARY FEATURES  AND EXTRAVASATED MUCIN of the Right breast, 10 o'clock. NO CARCINOMA of the Right axillary lymph node, inferior. This was found to be concordant by Dr. Curlene Dolphin. Pathology results were discussed with the patient by telephone. The patient reported doing well after the biopsies with tenderness at the sites. Post biopsy instructions and care were reviewed and questions were answered. The patient was encouraged to call [facility] for any additional concerns. Surgical consultation has been arranged with Dr. Fanny Skates at [facility] on August 05, 2017. Pathology results reported by Terie Purser, RN on 07/31/2017. Electronically Signed   By: Curlene Dolphin M.D.   On: 07/31/2017 14:28   Result Date: 08/03/2017 CLINICAL DATA:  Ultrasound-guided core needle biopsy was recommended of a right axillary lymph node with mild/borderline cortical thickening. The patient has a suspicious right breast mass which was biopsied today. EXAM: [place] AXILLARY NODE CORE BIOPSY RIGHT COMPARISON:  Previous exam(s). FINDINGS: I met with the patient and we discussed the procedure of ultrasound-guided biopsy, including benefits and alternatives. We discussed the high likelihood of a successful procedure. We discussed the risks of the procedure, including infection, bleeding, tissue injury, clip migration, and inadequate sampling. Informed written consent was given. The usual time-out protocol was performed immediately prior to the procedure. Using sterile technique and 1% Lidocaine as local anesthetic, under direct ultrasound visualization, a 14 gauge spring-loaded device was used to perform biopsy of an inferior right axillary lymph node using a lateral approach. At the conclusion of the procedure a HydroMARK tissue marker clip was deployed into the biopsy cavity. Follow up 2 view mammogram was performed and dictated separately. IMPRESSION: Ultrasound guided biopsy of right axillary lymph node. No  apparent complications. Electronically Signed: By: Curlene Dolphin M.D. On: 07/30/2017 13:24   Mm Clip Placement Right  Result Date: 07/30/2017 CLINICAL DATA:  Ultrasound-guided biopsies of a right breast mass at 10 o'clock position and of a inferior right axillary lymph node were performed today. EXAM: DIAGNOSTIC RIGHT MAMMOGRAM POST ULTRASOUND BIOPSIES COMPARISON:  Previous exam(s). FINDINGS: Mammographic images were obtained following ultrasound guided biopsy of an upper-outer quadrant right breast mass and of a right axillary lymph node. Ribbon shaped biopsy clip is satisfactorily positioned within the biopsied mass. The mass is obscured on the 2D images by the patient's extremely dense breast parenchyma. HydroMARK biopsy clip projects over the inferior right axilla. IMPRESSION: Satisfactory position of ribbon shaped and HydroMARK biopsy clips. Final Assessment: Post Procedure Mammograms for Marker Placement Electronically Signed   By: Curlene Dolphin M.D.   On: 07/30/2017 13:26   [place] Rt Breast Bx W Loc Dev 1st Lesion Img Bx Spec US Guide  Addendum Date: 08/03/2017   ADDENDUM REPORT: 07/31/2017 14:29 ADDENDUM: Pathology revealed GRADE II INVASIVE DUCTAL CARCINOMA WITH MICROPAPILLARY FEATURES AND EXTRAVASATED MUCIN of the Right breast, 10 o'clock. NO CARCINOMA of the Right axillary lymph node, inferior. These results were found to be concordant by Dr. Curlene Dolphin. Pathology results were discussed with the patient by telephone. The patient reported doing well after the biopsies with  tenderness at the sites. Post biopsy instructions and care were reviewed and questions were answered. The patient was encouraged to call [facility] for any additional concerns. Surgical consultation has been arranged with Dr. Fanny Skates at [facility] on August 05, 2017. Pathology results reported by Terie Purser, RN on 07/31/2017. Electronically Signed   By: Curlene Dolphin M.D.   On:  07/31/2017 14:29   Result Date: 08/03/2017 CLINICAL DATA:  47 year old patient presents for biopsy a suspicious palpable mass in the upper-outer quadrant of the right breast, 10 o'clock position. While with the patient today in the ultrasound room, she describes to me an approximately 2 week history intermittent shortness of breath, mild chest pain, fatigue, and just not feeling well. She tells me that she exercises regularly, but recently, she is unable to tolerate exercise that would typically be easy for her. EXAM: ULTRASOUND GUIDED LEFT BREAST CORE NEEDLE BIOPSY COMPARISON:  Previous exam(s). FINDINGS: I met with the patient and we discussed the procedure of ultrasound-guided biopsy, including benefits and alternatives. We discussed the high likelihood of a successful procedure. We discussed the risks of the procedure, including infection, bleeding, tissue injury, clip migration, and inadequate sampling. Informed written consent was given. The usual time-out protocol was performed immediately prior to the procedure. Lesion quadrant: Upper outer quadrant Using sterile technique and 1% Lidocaine as local anesthetic, under direct ultrasound visualization, a 12 gauge spring-loaded device was used to perform biopsy of a palpable hypoechoic mass at 10 o'clock position using a lateral approach. At the conclusion of the procedure a ribbon tissue marker clip was deployed into the biopsy cavity. Follow up 2 view mammogram was performed and dictated separately. IMPRESSION: Ultrasound guided biopsy of the right breast. No apparent complications. The patient describes new onset shortness of breath with mild activity, occasional chest pain, fatigue, and not feeling well. We took her blood pressure while she was in the office today, and it was normal at 102/78. She was encouraged by myself and our nurse to seek evaluation in the emergency department. I spoke with the patient's nurse practitioner at [facility], Laurin Coder,  who agrees that the patient should be seen at [facility] Emergency Department. She also asked me to tell the patient to stop her hormone replacement therapy. The patient tells me that she has not been taking it for approximately 1 week. Electronically Signed: By: Curlene Dolphin M.D. On: 07/30/2017 13:24      IMPRESSION: 47 year-old woman with invasive ductal carcinoma of the right breast, with micropapillary features. ER/PR positive. Clinical stage T3N0.  The patient has a large tumor which on MRI may extend up to 7 cm in size. She would not be a good candidate for upfront lumpectomy given the size of the tumor. The patient's systemic symptoms with chest pain, fatigue, upper back pain, and shortness of breath which is hard to explain based on her breast cancer. In light of these symptoms the patient will proceed with staging work up including CT scan and bone scan. Patient would be a good candidate for post-mastectomy radiation therapy given the size of her lesion. Today we discussed general indications for post-mastectomy radiation therapy as well as anticipated course of treatment, side effects, and toxicities. Patient is interested in reconstruction and I recommended she meet with plastic surgeon per Dr. Darrel Hoover recommendations prior to anticipated mastectomy.   The patient would like to make some financial arrangements with her husband and she anticipates possible delay in  her surgery due to these issues. In light of this the patient was placed on Femara and will start this later this week.   Today, I talked to the patient about the findings and work-up thus far.  We discussed the natural history of breast cancer and general treatment, highlighting the role of radiotherapy in the management.  We discussed the available radiation techniques, and focused on the details of logistics and delivery.  We reviewed the anticipated acute and late sequelae associated with radiation in this setting.  The patient was  encouraged to ask questions that I answered to the best of my ability.  A patient consent form was discussed and signed.  We retained a copy for our records.    PLAN:  1. Neo-adjuvant hormonal therapy 2. CT bone scan for staging purposes 3. Plastic surgery consultation 4. Anticipated mastectomy and sentinel node procedure 5. Oncotype Dx testing to determine the potential benefit of adjuvant chemotherapy 6. Post-mastectomy radiation therapy     ------------------------------------------------  Blair Promise, PhD, MD  This document serves as a record of services personally performed by Gery Pray, MD. It was created on his behalf by Arlyce Harman, a trained medical scribe. The creation of this record is based on the scribe's personal observations and the provider's statements to them. This document has been checked and approved by the attending provider.

## 2017-08-12 NOTE — Telephone Encounter (Signed)
No 8/7 los

## 2017-08-18 ENCOUNTER — Encounter: Payer: Self-pay | Admitting: *Deleted

## 2017-08-18 NOTE — Progress Notes (Signed)
[place] Psychosocial Distress Screening Clinical Social Work  Clinical Social Work was referred by distress screening protocol.  The patient scored a 7 on the Psychosocial Distress Thermometer which indicates moderate distress. Clinical Social Worker contacted patient by phone to assess for distress and other psychosocial needs.  CSW left patient a message offering support and information on the support team and support services at [facility].  CSW provided contact information and encouraged patient to return call.     ONCBCN DISTRESS SCREENING 08/12/2017  Screening Type Initial Screening  Distress experienced in past week (1-10) 7  Practical problem type Insurance;Work/school  Family Problem type Partner  Emotional problem type Nervousness/Anxiety;Adjusting to illness;Isolation/feeling alone  Spiritual/Religous concerns type Relating to God  Physical Problem type Pain;Nausea/vomiting;Breathing;Constipation/diarrhea    Johnnye Lana, MSW, LCSW, OSW-C Clinical Social Worker [facility] [phone]

## 2017-08-21 ENCOUNTER — Encounter (HOSPITAL_COMMUNITY): Payer: BLUE CROSS/BLUE SHIELD

## 2017-08-21 ENCOUNTER — Ambulatory Visit (HOSPITAL_COMMUNITY): Payer: BLUE CROSS/BLUE SHIELD

## 2017-08-25 ENCOUNTER — Encounter: Payer: Self-pay | Admitting: *Deleted

## 2017-08-25 NOTE — Progress Notes (Signed)
[place] Work  Hol[place] representative received return call from patient to discuss support services.  CSW educated patient on CSW role and the support team/support services available at [facility].  Patient expressed feeling overwhelmed by the number of appointments and stress her diagnosis adds to everyday life.  CSW and patient discussed common feelings and emotions when going through treatment and the importance of support.  Patient was agreeable to an [place] referral and expressed interest in attending the Breast Cancer support group.  Patient also requested to be added to the support programs mailing list.  Patient is scheduled to follow up with a member of the CSW team next week to further discuss resources and support during treatment.  CSW provided contact information and encouraged patient to call with additional needs or concerns.  Johnnye Lana, MSW, LCSW, OSW-C Clinical Social Worker [facility] [phone]

## 2017-08-28 ENCOUNTER — Encounter (HOSPITAL_COMMUNITY)
Admission: RE | Admit: 2017-08-28 | Discharge: 2017-08-28 | Disposition: A | Discharge status: Home / Self Care | Payer: BLUE CROSS/BLUE SHIELD | Source: Ambulatory Visit | Attending: Hematology and Oncology | Admitting: Hematology and Oncology

## 2017-08-28 ENCOUNTER — Inpatient Hospital Stay (HOSPITAL_BASED_OUTPATIENT_CLINIC_OR_DEPARTMENT_OTHER): Payer: BLUE CROSS/BLUE SHIELD | Admitting: Hematology and Oncology

## 2017-08-28 ENCOUNTER — Ambulatory Visit (HOSPITAL_COMMUNITY)
Admission: RE | Admit: 2017-08-28 | Discharge: 2017-08-28 | Disposition: A | Discharge status: Home / Self Care | Payer: BLUE CROSS/BLUE SHIELD | Source: Ambulatory Visit | Attending: Hematology and Oncology | Admitting: Hematology and Oncology

## 2017-08-28 DIAGNOSIS — R918 Other nonspecific abnormal finding of lung field: Secondary | ICD-10-CM

## 2017-08-28 DIAGNOSIS — R109 Unspecified abdominal pain: Secondary | ICD-10-CM | POA: Diagnosis not present

## 2017-08-28 DIAGNOSIS — Z170 Estrogen receptor positive status [ER+]: Secondary | ICD-10-CM | POA: Diagnosis present

## 2017-08-28 DIAGNOSIS — C50411 Malignant neoplasm of upper-outer quadrant of right female breast: Secondary | ICD-10-CM | POA: Diagnosis not present

## 2017-08-28 DIAGNOSIS — M25519 Pain in unspecified shoulder: Secondary | ICD-10-CM | POA: Insufficient documentation

## 2017-08-28 MED ORDER — TECHNETIUM TC 99M MEDRONATE IV KIT
20.0000 | PACK | Freq: Once | INTRAVENOUS | Status: AC | PRN
  Administered 2017-08-28: 19.7 via INTRAVENOUS

## 2017-08-28 MED ORDER — IOHEXOL 300 MG/ML  SOLN
100.0000 mL | Freq: Once | INTRAMUSCULAR | Status: AC | PRN
  Administered 2017-08-28: 100 mL via INTRAVENOUS

## 2017-08-28 NOTE — Assessment & Plan Note (Signed)
07/30/2017: Palpable nodules in the right breast: 10 o'clock position: U/S 4.1 cm mass, single abnormal lymph node: Biopsy benign, biopsy of the mass revealed IDC with micropapillary features and extravasated mucin, grade 2, EF 60%, PR 60%, Ki-67 5%, HER-2 negative ratio 1.32, MRI revealed additional satellite nodules together measured 7.5 cm, T3 N0 stage II clinical stage AJCC 8  Treatment plan: 1.  Right mastectomy with sentinel lymph node biopsy 2. breast reconstruction  3.  Oncotype DX testing to determine if she needs chemotherapy  4.  Adjuvant radiation therapy 5  followed by adjuvant antiestrogen therapy with letrozole 2.5 mg daily X 7 years -------------------------------------------------------------------------------- Current treatment: Letrozole 2.5 mg daily started 08/12/2017  CT chest abdomen and pelvis 08/28/2017: Few scattered pulmonary nodules throughout noted throughout the lungs bilaterally 5 mm or less, likely benign follow-up studies recommended  Bone scan: Pending  Return to clinic after surgery to discuss the pathology report

## 2017-08-28 NOTE — Progress Notes (Signed)
Patient Care Team: Avon Gully, NP as PCP - General (Obstetrics and Gynecology)  DIAGNOSIS:  Encounter Diagnosis  Name Primary?  . Malignant neoplasm of upper-outer quadrant of right breast in female, estrogen receptor positive ([place])     SUMMARY OF ONCOLOGIC HISTORY:   Malignant neoplasm of upper-outer quadrant of right breast in female, estrogen receptor positive ([place])   07/30/2017 Initial Diagnosis    Palpable nodules in the right breast: 10 o'clock position: U/S 4.1 cm mass, single abnormal lymph node: Biopsy benign, biopsy of the mass revealed IDC with micropapillary features and extravasated mucin, grade 2, EF 60%, PR 60%, Ki-67 5%, HER-2 negative ratio 1.32, MRI revealed additional satellite nodules together measured 7.5 cm, T3 N0 stage II clinical stage AJCC 8     CHIEF COMPLIANT: Follow-up to review the results of scans  INTERVAL HISTORY: Sabrina Spencer is a 47 year old with above-mentioned history of right breast cancer T3N0 stage II breast cancer who underwent staging studies with scans and is here today to discuss the results.  CT of the chest reveals small lung nodules 5 mm in size.  She had a bone scan and the results are pending.  REVIEW OF SYSTEMS:   Constitutional: Denies fevers, chills or abnormal weight loss Eyes: Denies blurriness of vision Ears, nose, mouth, throat, and face: Denies mucositis or sore throat Respiratory: Denies cough, dyspnea or wheezes Cardiovascular: Denies palpitation, chest discomfort Gastrointestinal:  Denies nausea, heartburn or change in bowel habits Skin: Denies abnormal skin rashes Lymphatics: Denies new lymphadenopathy or easy bruising Neurological:Denies numbness, tingling or new weaknesses Behavioral/Psych: Mood is stable, no new changes  Extremities: No lower extremity edema Breast: Palpable nodes All other systems were reviewed with the patient and are negative.  I have reviewed the past medical history, past surgical history,  social history and family history with the patient and they are unchanged from previous note.  ALLERGIES:  has No Known Allergies.  MEDICATIONS:  Current Outpatient Medications  Medication Sig Dispense Refill  . lamoTRIgine (LAMICTAL) 200 MG tablet   7  . letrozole (FEMARA) 2.5 MG tablet Take 1 tablet (2.5 mg total) by mouth daily. (Patient not taking: Reported on 08/12/2017) 90 tablet 3  . meloxicam (MOBIC) 7.5 MG tablet Take 1 tablet (7.5 mg total) by mouth daily. (Patient not taking: Reported on 02/10/2014) 30 tablet 0  . risperiDONE (RISPERDAL) 3 MG tablet   7  . zaleplon (SONATA) 10 MG capsule   4   No current facility-administered medications for this visit.     PHYSICAL EXAMINATION: ECOG PERFORMANCE STATUS: 1 - Symptomatic but completely ambulatory  Vitals:   08/28/17 1233  BP: (!) 127/93  Pulse: 79  Resp: 18  Temp: 98.2 F (36.8 C)  SpO2: 99%   Filed Weights   08/28/17 1233  Weight: 154 lb 4.8 oz (70 kg)    GENERAL:alert, no distress and comfortable SKIN: skin color, texture, turgor are normal, no rashes or significant lesions EYES: normal, Conjunctiva are pink and non-injected, sclera clear OROPHARYNX:no exudate, no erythema and lips, buccal mucosa, and tongue normal  NECK: supple, thyroid normal size, non-tender, without nodularity LYMPH:  no palpable lymphadenopathy in the cervical, axillary or inguinal LUNGS: clear to auscultation and percussion with normal breathing effort HEART: regular rate & rhythm and no murmurs and no lower extremity edema ABDOMEN:abdomen soft, non-tender and normal bowel sounds MUSCULOSKELETAL:no cyanosis of digits and no clubbing  NEURO: alert & oriented x 3 with fluent speech, no focal motor/sensory deficits EXTREMITIES: No  lower extremity edema   LABORATORY DATA:  I have reviewed the data as listed CMP Latest Ref Rng & Units 07/31/2017  Glucose 70 - 99 mg/dL 83  BUN 6 - 20 mg/dL 15  Creatinine 0.44 - 1.00 mg/dL 0.79  Sodium 135 -  145 mmol/L 139  Potassium 3.5 - 5.1 mmol/L 4.4  Chloride 98 - 111 mmol/L 108  CO2 22 - 32 mmol/L 22  Calcium 8.9 - 10.3 mg/dL 9.1    Lab Results  Component Value Date   WBC 5.4 07/31/2017   HGB 14.2 07/31/2017   HCT 43.1 07/31/2017   MCV 98.2 07/31/2017   PLT 217 07/31/2017   NEUTROABS 2.7 07/31/2017    ASSESSMENT & PLAN:  Malignant neoplasm of upper-outer quadrant of right breast in female, estrogen receptor positive ([place]) 07/30/2017: Palpable nodules in the right breast: 10 o'clock position: U/S 4.1 cm mass, single abnormal lymph node: Biopsy benign, biopsy of the mass revealed IDC with micropapillary features and extravasated mucin, grade 2, EF 60%, PR 60%, Ki-67 5%, HER-2 negative ratio 1.32, MRI revealed additional satellite nodules together measured 7.5 cm, T3 N0 stage II clinical stage AJCC 8  Treatment plan: 1.  Right mastectomy with sentinel lymph node biopsy 2. breast reconstruction  3.  Oncotype DX testing to determine if she needs chemotherapy  4.  Adjuvant radiation therapy 5  followed by adjuvant antiestrogen therapy with letrozole 2.5 mg daily X 7 years -------------------------------------------------------------------------------- Current treatment: Letrozole 2.5 mg daily started 08/12/2017  CT chest abdomen and pelvis 08/28/2017: Few scattered pulmonary nodules throughout noted throughout the lungs bilaterally 5 mm or less, likely benign follow-up studies recommended Bone scan is pending.  I reviewed the images and there is some uptake in the lower lumbar area.  It could be arthritis since the CT scan did not pick up any activity.  We will call her with the official radiology results are available. Bone scan: Pending  Return to clinic after surgery to discuss the pathology report  No orders of the defined types were placed in this encounter.  The patient has a good understanding of the overall plan. she agrees with it. she will call with any problems that may  develop before the next visit here.   Harriette Ohara, MD 08/28/17

## 2017-08-31 ENCOUNTER — Telehealth: Payer: Self-pay

## 2017-08-31 NOTE — Telephone Encounter (Signed)
Per Dr Lindi Adie may call and inform pt bone scan results are normal.  Number provided identified pt's name on voicemail.  Message left by nurse informing pt scan results are normal.

## 2017-09-03 ENCOUNTER — Encounter: Payer: Self-pay | Admitting: General Practice

## 2017-09-03 NOTE — Progress Notes (Signed)
[place] CSW Progress Note  Met w patient and husband in [place] office, discussed normal feelings surrounding cancer diagnosis and treatment.  Affirmed normal anxiety and encouraged healthy coping strategies.  Provided resources as requested, referred for consult w dietitian and connection w nurse navigator.  Edwyna Shell, LCSW Clinical Social Worker Phone:  [phone]

## 2017-09-11 ENCOUNTER — Other Ambulatory Visit: Payer: Self-pay | Admitting: General Surgery

## 2017-09-11 DIAGNOSIS — Z170 Estrogen receptor positive status [ER+]: Principal | ICD-10-CM

## 2017-09-11 DIAGNOSIS — C50111 Malignant neoplasm of central portion of right female breast: Secondary | ICD-10-CM

## 2017-09-17 ENCOUNTER — Telehealth: Payer: Self-pay | Admitting: Hematology and Oncology

## 2017-09-17 ENCOUNTER — Telehealth: Payer: Self-pay

## 2017-09-17 DIAGNOSIS — C50411 Malignant neoplasm of upper-outer quadrant of right female breast: Secondary | ICD-10-CM

## 2017-09-17 DIAGNOSIS — Z170 Estrogen receptor positive status [ER+]: Secondary | ICD-10-CM

## 2017-09-17 NOTE — Telephone Encounter (Signed)
Returned patient's call.  Patient concerned with findings on bone scan regarding lumbar area.  Per patient, severe back pain for the past several months.  Reviewed results with Dr. Lindi Adie, Dr. Lindi Adie okay to proceed with MRI with and without contrast to lumbar spine.  Left voicemail for patient to return call to update on orders.

## 2017-09-17 NOTE — Telephone Encounter (Signed)
Patient states that nurse has not called her back after leaving a message for 2wks

## 2017-09-18 ENCOUNTER — Telehealth: Payer: Self-pay | Admitting: Hematology and Oncology

## 2017-09-18 NOTE — Telephone Encounter (Signed)
Spoke to pt regarding upcoming appts per 9/10 sch message

## 2017-09-21 ENCOUNTER — Inpatient Hospital Stay: Payer: BLUE CROSS/BLUE SHIELD | Attending: Hematology and Oncology | Admitting: Nutrition

## 2017-09-21 NOTE — Progress Notes (Signed)
47 year old female diagnosed with ER positive breast cancer.  She is a patient of Dr. Sonny Dandy.  Past medical history was not significant.  Medications include Femara.  Labs were reviewed.  Height: 5 feet 2 inches. Weight: 154.3 pounds. BMI: 28.22.  Patient did not know why she has been referred to nutrition.   She wonders if there is a special diet she should be following. Plan is for surgery and radiation therapy at this time.   Patient is not exhibiting nutrition impact symptoms.  Nutrition diagnosis:  Food and nutrition related knowledge deficit related to breast cancer and associated treatments as evidenced by no prior need for nutrition related information.  Intervention: I educated patient that there is not a special diet she should follow while undergoing treatment other than that of a healthy plant-based diet. Reviewed importance of increased whole grains, fruits, and vegetables. Recommended lean protein foods. Provided handouts on plant-based diet, nutrition for breast cancer survivors. Questions were answered.  Teach back method used.  Contact information provided.  Monitoring, evaluation, goals: Patient will tolerate healthy plant-based diet to promote weight maintenance during treatment.  Nutrition diagnosis has been resolved.  No follow-up scheduled.  **Disclaimer: This note was dictated with voice recognition software. Similar sounding words can inadvertently be transcribed and this note may contain transcription errors which may not have been corrected upon publication of note.**

## 2017-09-24 ENCOUNTER — Ambulatory Visit (HOSPITAL_COMMUNITY)
Admission: RE | Admit: 2017-09-24 | Discharge: 2017-09-24 | Disposition: A | Discharge status: Home / Self Care | Payer: BLUE CROSS/BLUE SHIELD | Source: Ambulatory Visit | Attending: Hematology and Oncology | Admitting: Hematology and Oncology

## 2017-09-24 ENCOUNTER — Telehealth: Payer: Self-pay | Admitting: *Deleted

## 2017-09-24 ENCOUNTER — Telehealth: Payer: Self-pay | Admitting: Hematology and Oncology

## 2017-09-24 DIAGNOSIS — M549 Dorsalgia, unspecified: Secondary | ICD-10-CM | POA: Diagnosis not present

## 2017-09-24 DIAGNOSIS — M47817 Spondylosis without myelopathy or radiculopathy, lumbosacral region: Secondary | ICD-10-CM | POA: Insufficient documentation

## 2017-09-24 DIAGNOSIS — M48061 Spinal stenosis, lumbar region without neurogenic claudication: Secondary | ICD-10-CM | POA: Insufficient documentation

## 2017-09-24 DIAGNOSIS — Z170 Estrogen receptor positive status [ER+]: Secondary | ICD-10-CM

## 2017-09-24 DIAGNOSIS — C50411 Malignant neoplasm of upper-outer quadrant of right female breast: Secondary | ICD-10-CM | POA: Diagnosis not present

## 2017-09-24 DIAGNOSIS — M2578 Osteophyte, vertebrae: Secondary | ICD-10-CM | POA: Insufficient documentation

## 2017-09-24 DIAGNOSIS — R937 Abnormal findings on diagnostic imaging of other parts of musculoskeletal system: Secondary | ICD-10-CM

## 2017-09-24 DIAGNOSIS — M5136 Other intervertebral disc degeneration, lumbar region: Secondary | ICD-10-CM | POA: Diagnosis not present

## 2017-09-24 DIAGNOSIS — M5134 Other intervertebral disc degeneration, thoracic region: Secondary | ICD-10-CM | POA: Insufficient documentation

## 2017-09-24 DIAGNOSIS — M47814 Spondylosis without myelopathy or radiculopathy, thoracic region: Secondary | ICD-10-CM | POA: Diagnosis not present

## 2017-09-24 MED ORDER — GADOBUTROL 1 MMOL/ML IV SOLN
7.0000 mL | Freq: Once | INTRAVENOUS | Status: AC | PRN
  Administered 2017-09-24: 7 mL via INTRAVENOUS

## 2017-09-24 NOTE — Telephone Encounter (Signed)
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  Sabrina Spencer states she does have pain that is in her upper back as well ( cervical - which was not evaluated in MRI ) and she has been in the process of moving which involves packing, lifting and extended use of arms.  Per MD recommendation - advised to use tylenol, ice and stretches that lengthen her spine as well as MD would like her to be seen by an orthopedist for further recommendations.  Sabrina Spencer did state concern due to pending breast surgery and back issues " I want Dr Dalbert Batman to know these results "  This RN informed pt above information would be forwarded to him for communication of her concern.

## 2017-09-24 NOTE — Telephone Encounter (Signed)
This RN called referral to [facility] for appointment with Dr Tonita Cong.

## 2017-09-24 NOTE — Telephone Encounter (Signed)
Faxed medical records to [facility], Release ID: [id number]

## 2017-09-30 ENCOUNTER — Telehealth: Payer: Self-pay | Admitting: General Practice

## 2017-09-30 NOTE — Telephone Encounter (Signed)
[place] CSW Progress Note  Call to patient, concerned about high medical bills and loss of income due to her cancer diagnosis and treatment.  Referred to billing for additional assistance, would like payment plan.  Edwyna Shell, LCSW Clinical Social Worker Phone:  [phone]

## 2017-10-05 DIAGNOSIS — M51369 Other intervertebral disc degeneration, lumbar region without mention of lumbar back pain or lower extremity pain: Secondary | ICD-10-CM | POA: Insufficient documentation

## 2017-10-05 DIAGNOSIS — M5136 Other intervertebral disc degeneration, lumbar region: Secondary | ICD-10-CM

## 2017-10-05 HISTORY — DX: Other intervertebral disc degeneration, lumbar region without mention of lumbar back pain or lower extremity pain: M51.369

## 2017-10-05 HISTORY — DX: Other intervertebral disc degeneration, lumbar region: M51.36

## 2017-10-05 NOTE — Pre-Procedure Instructions (Signed)
Sabrina Spencer  10/05/2017      [id number] - [place], [place] - [address] [place] [address] Phone: [phone] Fax: [phone]    Your procedure is scheduled on Tuesday October 8.  Report to [facility] Admitting at 8:30 A.M.  Call this number if you have problems the morning of surgery:  [phone]   Remember:  Do not eat or drink after midnight.  You may drink clear liquids until 7:30 AM .  Clear liquids allowed are:  Water, Juice (non-citric and without pulp), Black Coffee only and Gatorade  NO DAIRY OR MILK PRODUCTS   Take these medicines the morning of surgery with A SIP OF WATER: Acetaminophen (tylenol) if needed  7 days prior to surgery STOP taking any Aspirin(unless otherwise instructed by your surgeon), Aleve, Naproxen, Ibuprofen, Motrin, Advil, Goody's, BC's, all herbal medications, fish oil, and all vitamins     Do not wear jewelry, make-up or nail polish.  Do not wear lotions, powders, or perfumes, or deodorant.  Do not shave 48 hours prior to surgery.  Men may shave face and neck.  Do not bring valuables to the hospital.  [facility] is not responsible for any belongings or valuables.  Contacts, dentures or bridgework may not be worn into surgery.  Leave your suitcase in the car.  After surgery it may be brought to your room.  For patients admitted to the hospital, discharge time will be determined by your treatment team.  Patients discharged the day of surgery will not be allowed to drive home.   Special instructions:    [place]- Preparing For Surgery  Before surgery, you can play an important role. Because skin is not sterile, your skin needs to be as free of germs as possible. You can reduce the number of germs on your skin by washing with CHG (chlorahexidine gluconate) Soap before surgery.  CHG is an antiseptic cleaner which kills germs and bonds with the skin to continue killing germs even after  washing.    Oral Hygiene is also important to reduce your risk of infection.  Remember - BRUSH YOUR TEETH THE MORNING OF SURGERY WITH YOUR REGULAR TOOTHPASTE  Please do not use if you have an allergy to CHG or antibacterial soaps. If your skin becomes reddened/irritated stop using the CHG.  Do not shave (including legs and underarms) for at least 48 hours prior to first CHG shower. It is OK to shave your face.  Please follow these instructions carefully.   1. Shower the NIGHT BEFORE SURGERY and the MORNING OF SURGERY with CHG.   2. If you chose to wash your hair, wash your hair first as usual with your normal shampoo.  3. After you shampoo, rinse your hair and body thoroughly to remove the shampoo.  4. Use CHG as you would any other liquid soap. You can apply CHG directly to the skin and wash gently with a scrungie or a clean washcloth.   5. Apply the CHG Soap to your body ONLY FROM THE NECK DOWN.  Do not use on open wounds or open sores. Avoid contact with your eyes, ears, mouth and genitals (private parts). Wash Face and genitals (private parts)  with your normal soap.  6. Wash thoroughly, paying special attention to the area where your surgery will be performed.  7. Thoroughly rinse your body with warm water from the neck down.  8. DO NOT shower/wash with your normal soap  after using and rinsing off the CHG Soap.  9. Pat yourself dry with a CLEAN TOWEL.  10. Wear CLEAN PAJAMAS to bed the night before surgery, wear comfortable clothes the morning of surgery  11. Place CLEAN SHEETS on your bed the night of your first shower and DO NOT SLEEP WITH PETS.    Day of Surgery:  Do not apply any deodorants/lotions.  Please wear clean clothes to the hospital/surgery center.   Remember to brush your teeth WITH YOUR REGULAR TOOTHPASTE.    Please read over the following fact sheets that you were given. Coughing and Deep Breathing and Surgical Site Infection Prevention

## 2017-10-06 ENCOUNTER — Other Ambulatory Visit: Payer: Self-pay

## 2017-10-06 ENCOUNTER — Encounter (HOSPITAL_COMMUNITY): Payer: Self-pay

## 2017-10-06 ENCOUNTER — Encounter (HOSPITAL_COMMUNITY)
Admission: RE | Admit: 2017-10-06 | Discharge: 2017-10-06 | Disposition: A | Discharge status: Home / Self Care | Payer: BLUE CROSS/BLUE SHIELD | Source: Ambulatory Visit | Attending: General Surgery | Admitting: General Surgery

## 2017-10-06 DIAGNOSIS — Z01812 Encounter for preprocedural laboratory examination: Secondary | ICD-10-CM | POA: Insufficient documentation

## 2017-10-06 HISTORY — DX: Anemia, unspecified: D64.9

## 2017-10-06 HISTORY — DX: Unspecified osteoarthritis, unspecified site: M19.90

## 2017-10-06 LAB — CBC WITH DIFFERENTIAL/PLATELET
Abs Immature Granulocytes: 0 10*3/uL (ref 0.0–0.1)
Basophils Absolute: 0.1 10*3/uL (ref 0.0–0.1)
Basophils Relative: 1 %
Eosinophils Absolute: 0.2 10*3/uL (ref 0.0–0.7)
Eosinophils Relative: 5 %
HCT: 42.3 % (ref 36.0–46.0)
Hemoglobin: 13.8 g/dL (ref 12.0–15.0)
Immature Granulocytes: 0 %
Lymphocytes Relative: 25 %
Lymphs Abs: 1.1 10*3/uL (ref 0.7–4.0)
MCH: 32.4 pg (ref 26.0–34.0)
MCHC: 32.6 g/dL (ref 30.0–36.0)
MCV: 99.3 fL (ref 78.0–100.0)
Monocytes Absolute: 0.7 10*3/uL (ref 0.1–1.0)
Monocytes Relative: 16 %
Neutro Abs: 2.4 10*3/uL (ref 1.7–7.7)
Neutrophils Relative %: 53 %
Platelets: 217 10*3/uL (ref 150–400)
RBC: 4.26 MIL/uL (ref 3.87–5.11)
RDW: 12.6 % (ref 11.5–15.5)
WBC: 4.5 10*3/uL (ref 4.0–10.5)

## 2017-10-06 LAB — COMPREHENSIVE METABOLIC PANEL
ALT: 18 U/L (ref 0–44)
AST: 24 U/L (ref 15–41)
Albumin: 4 g/dL (ref 3.5–5.0)
Alkaline Phosphatase: 51 U/L (ref 38–126)
Anion gap: 7 (ref 5–15)
BUN: 14 mg/dL (ref 6–20)
CO2: 25 mmol/L (ref 22–32)
Calcium: 9.3 mg/dL (ref 8.9–10.3)
Chloride: 106 mmol/L (ref 98–111)
Creatinine, Ser: 0.72 mg/dL (ref 0.44–1.00)
GFR calc Af Amer: >60 mL/min (ref >60)
GFR calc non Af Amer: >60 mL/min (ref >60)
Glucose, Bld: 94 mg/dL (ref 70–99)
Potassium: 4.3 mmol/L (ref 3.5–5.1)
Sodium: 138 mmol/L (ref 135–145)
Total Bilirubin: 0.7 mg/dL (ref 0.3–1.2)
Total Protein: 7.3 g/dL (ref 6.5–8.1)

## 2017-10-06 NOTE — Progress Notes (Signed)
PCP is Dr. [facility]  LOV 07/2017 Oncologist is Dr. Willene Hatchet 08/2017 Denies murmur, cp, sob currently She did state that her son has had recent cold, and she feels like she may be getting some head congestion. Denies fever, coughing up discolored sputum.

## 2017-10-06 NOTE — Pre-Procedure Instructions (Signed)
Sabrina Spencer  10/06/2017      [id number] - [place], [place] - [address]  [address] Phone: [phone] Fax: [phone]    Your procedure is scheduled on Tuesday October 8.   Report to [facility] Admitting at 8:30 A.M.              (posted surgery time 10:30a - 1p)   Call this number if you have problems the morning of surgery:  [phone]   Remember:   Do not eat any foods after midnight, Monday.   You may drink clear liquids until 7:30 AM .  Clear liquids allowed are:  Water, Juice (non-citric and without pulp), Black Coffee only and Gatorade  NO DAIRY OR MILK PRODUCTS   Take these medicines the morning of surgery with A SIP OF WATER: Acetaminophen (tylenol) if needed  7 days prior to surgery STOP taking any Aspirin(unless otherwise instructed by your surgeon), Aleve, Naproxen, Ibuprofen, Motrin, Advil, Goody's, BC's, all herbal medications, fish oil, and all vitamins     Do not wear jewelry, make-up or nail polish.  Do not wear lotions, powders, or perfumes, or deodorant.  Do not shave 48 hours prior to surgery.    Do not bring valuables to the hospital.  [facility] is not responsible for any belongings or valuables.  Contacts, dentures or bridgework may not be worn into surgery.  Leave your suitcase in the car.  After surgery it may be brought to your room.  For patients admitted to the hospital, discharge time will be determined by your treatment team.     Special instructions:    [place]- Preparing For Surgery  Before surgery, you can play an important role. Because skin is not sterile, your skin needs to be as free of germs as possible. You can reduce the number of germs on your skin by washing with CHG (chlorahexidine gluconate) Soap before surgery.  CHG is an antiseptic cleaner which kills germs and bonds with the skin to continue killing germs even after washing.    Oral Hygiene is also  important to reduce your risk of infection.    Remember - BRUSH YOUR TEETH THE MORNING OF SURGERY WITH YOUR REGULAR TOOTHPASTE  Please do not use if you have an allergy to CHG or antibacterial soaps. If your skin becomes reddened/irritated stop using the CHG.  Do not shave (including legs and underarms) for at least 48 hours prior to first CHG shower. It is OK to shave your face.  Please follow these instructions carefully.   1. Shower the NIGHT BEFORE SURGERY and the MORNING OF SURGERY with CHG.   2. If you chose to wash your hair, wash your hair first as usual with your normal shampoo.  3. After you shampoo, rinse your hair and body thoroughly to remove the shampoo.  4. Use CHG as you would any other liquid soap. You can apply CHG directly to the skin and wash gently with a scrungie or a clean washcloth.   5. Apply the CHG Soap to your body ONLY FROM THE NECK DOWN.  Do not use on open wounds or open sores. Avoid contact with your eyes, ears, mouth and genitals (private parts). Wash Face and genitals (private parts)  with your normal soap.  6. Wash thoroughly, paying special attention to the area where your surgery will be performed.  7. Thoroughly rinse your body with warm water from the neck down.  8. DO NOT shower/wash with your normal soap after using and rinsing off the CHG Soap.  9. Pat yourself dry with a CLEAN TOWEL.  10. Wear CLEAN PAJAMAS to bed the night before surgery, wear comfortable clothes the morning of surgery  11. Place CLEAN SHEETS on your bed the night of your first shower and DO NOT SLEEP WITH PETS.  Day of Surgery:  Do not apply any deodorants/lotions.  Please wear clean clothes to the hospital/surgery center.   Remember to brush your teeth WITH YOUR REGULAR TOOTHPASTE.    Please read over the following fact sheets that you were given. Coughing and Deep Breathing and Surgical Site Infection Prevention

## 2017-10-07 NOTE — H&P (Signed)
Sabrina Spencer Location: [facility] Surgery Patient #: [id number] DOB: 1970/03/21 Married / Language: English / Race: Undefined Female       History of Present Illness        This is a 47 year old female who returns for a preop visit scheduled surgery for her right breast cancer. Her husband and son are with her. Her oncologist is Dr. Lindi Adie. She has seen Dr. Sondra Come. She has now seen 3 plastic surgeons in [place], Dr. Iran Planas, Dr. Harlow Mares, and Dr. Luvenia Heller.      She recently noticed a breast mass in the right breast and imaging studies showed a 4.1 cm mass by mammogram and a 4.7 cm mass by ultrasound right breast, 10 o'clock position. Abnormal lymph nodes were suggested. Left breast looked normal. Category D density. 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 ER 60%. PR 60%. HER-2 negative. Ki-67 5%. Breast MRI shows 4.5 cm cancer in the upper outer right breast but there is irregular non-mass enhancement extending anteriorly and inferiorly making this suspicious area 7.5 cm. Further tissue biopsies versus suggested if necessary. The left breast looked normal. There were no abnormal lymph nodes.      Bone scan and CT scan showed some subtle findings thought to be benign. Follow-up in 3 months warranted. This has been evaluated by Dr. Lindi Adie. She did not qualify for genetics since family history is negative and she is 29.      She is concerned about implant-based reconstruction. She is fearful that this will impair MRI imaging. She has decided that she wants bilateral mastectomies without reconstruction at this time. She states that she would like to complete radiation therapy and any further interventions that are necessary and then consider autologous tissue transfer such as TRAM or DIEP flap at [facility] down the road. I told  her that was reasonable and that is what we will do       She will be scheduled for injection of blue dye right breast, right total mastectomy, right axillary deep Sentinel lymph node biopsy, left prophylactic mastectomy in the near future. I discussed the indications, details, techniques, and numerous risk of the surgery with her and her family. She is aware of the risk of bleeding, infection, reoperation for bleeding or complications, arm swelling, arm numbness, shoulder disability. Dissatisfaction with cosmetic contour and other unforeseen problems. She understands all of these issues. All of her questions are answered. She agrees with this plan.       Following surgery she will undergo adjuvant radiation therapy followed by adjuvant antiestrogen therapy with letrozole for 7 years She has already started neoadjuvant letrozole.     Vitals  Weight: 154.2 lb Height: 62in Height was reported by patient. Body Surface Area: 1.71 m Body Mass Index: 28.2 kg/m  Temp.: 98.30F  Pulse: 114 (Regular)  BP: 110/78 (Sitting, Left Arm, Standard)      Physical Exam  General Mental Status-Alert. General Appearance-Not in acute distress. Build & Nutrition-Well nourished. Posture-Normal posture. Gait-Normal.  Head and Neck Head-normocephalic, atraumatic with no lesions or palpable masses. Trachea-midline. Thyroid Gland Characteristics - normal size and consistency and no palpable nodules.  Chest and Lung Exam Chest and lung exam reveals -on auscultation, normal breath sounds, no adventitious sounds and normal vocal resonance.  Breast Note: Right breast reveals at least 5 cm mass transversely, made  bigger. Mobile. Skin not involved. Chest wall doesn't appear involved. No other obvious masses in either breast. No palpable adenopathy. Ecchymoses have resolved. No cervical or supraclavicular adenopathy.   Cardiovascular Cardiovascular examination reveals  -normal heart sounds, regular rate and rhythm with no murmurs and femoral artery auscultation bilaterally reveals normal pulses, no bruits, no thrills.  Abdomen Inspection Inspection of the abdomen reveals - No Hernias. Palpation/Percussion Palpation and Percussion of the abdomen reveal - Soft, Non Tender, No Rigidity (guarding), No hepatosplenomegaly and No Palpable abdominal masses.  Neurologic Neurologic evaluation reveals -alert and oriented x 3 with no impairment of recent or remote memory, normal attention span and ability to concentrate, normal sensation and normal coordination.  Musculoskeletal Normal Exam - Bilateral-Upper Extremity Strength Normal and Lower Extremity Strength Normal.    Assessment & Plan  PRIMARY CANCER OF UPPER OUTER QUADRANT OF RIGHT FEMALE BREAST (C50.411)   You have now seen 3 plastic surgeons We discussed her MRI last time which shows more extensive suspicious enhancement throughout the right breast, but the left breast looks fine You have decided that he would like bilateral mastectomies without reconstruction It is your preference to have AUTOLOGOUS reconstruction with TRAM or DIEP at [facility] at a later date following recovery from radiation therapy, and if necessary, chemotherapy treatments. All of this is very reasonable and that will be our plan  I gave you a prescription for postmastectomy supplies and a camisole that you can get at the [employer] on [address]  you'll be scheduled for right total mastectomy with right axillary sentinel lymph node biopsy and left prophylactic mastectomy There will not be any immediate reconstruction I discussed the indications, techniques, and risk of the surgery in detail with you and your family We will schedule the surgery at her convenience, probably early October, which is your preference    Edsel Petrin. Dalbert Batman, M.D., [facility] General and Minimally invasive  Surgery Breast and Colorectal Surgery Office:   [phone] Pager:   [phone]

## 2017-10-13 ENCOUNTER — Encounter (HOSPITAL_COMMUNITY)
Admission: RE | Disposition: A | Discharge status: Home / Self Care | Payer: Self-pay | Source: Ambulatory Visit | Attending: General Surgery

## 2017-10-13 ENCOUNTER — Encounter (HOSPITAL_COMMUNITY)
Admission: RE | Admit: 2017-10-13 | Discharge: 2017-10-13 | Disposition: A | Discharge status: Home / Self Care | Payer: BLUE CROSS/BLUE SHIELD | Source: Ambulatory Visit | Attending: General Surgery | Admitting: General Surgery

## 2017-10-13 ENCOUNTER — Ambulatory Visit (HOSPITAL_COMMUNITY): Payer: BLUE CROSS/BLUE SHIELD | Admitting: Certified Registered Nurse Anesthetist

## 2017-10-13 ENCOUNTER — Other Ambulatory Visit: Payer: Self-pay

## 2017-10-13 ENCOUNTER — Ambulatory Visit (HOSPITAL_COMMUNITY)
Admission: RE | Admit: 2017-10-13 | Discharge: 2017-10-14 | Disposition: A | Discharge status: Home / Self Care | Payer: BLUE CROSS/BLUE SHIELD | Source: Ambulatory Visit | Attending: General Surgery | Admitting: General Surgery

## 2017-10-13 ENCOUNTER — Encounter (HOSPITAL_COMMUNITY): Payer: Self-pay | Admitting: Anesthesiology

## 2017-10-13 DIAGNOSIS — M1990 Unspecified osteoarthritis, unspecified site: Secondary | ICD-10-CM | POA: Insufficient documentation

## 2017-10-13 DIAGNOSIS — C773 Secondary and unspecified malignant neoplasm of axilla and upper limb lymph nodes: Secondary | ICD-10-CM | POA: Insufficient documentation

## 2017-10-13 DIAGNOSIS — N6012 Diffuse cystic mastopathy of left breast: Secondary | ICD-10-CM | POA: Diagnosis not present

## 2017-10-13 DIAGNOSIS — Z79899 Other long term (current) drug therapy: Secondary | ICD-10-CM | POA: Insufficient documentation

## 2017-10-13 DIAGNOSIS — Z170 Estrogen receptor positive status [ER+]: Secondary | ICD-10-CM | POA: Diagnosis not present

## 2017-10-13 DIAGNOSIS — N6002 Solitary cyst of left breast: Secondary | ICD-10-CM | POA: Insufficient documentation

## 2017-10-13 DIAGNOSIS — C50411 Malignant neoplasm of upper-outer quadrant of right female breast: Secondary | ICD-10-CM | POA: Insufficient documentation

## 2017-10-13 DIAGNOSIS — Z4001 Encounter for prophylactic removal of breast: Secondary | ICD-10-CM | POA: Diagnosis not present

## 2017-10-13 DIAGNOSIS — C50111 Malignant neoplasm of central portion of right female breast: Secondary | ICD-10-CM

## 2017-10-13 HISTORY — PX: MASTECTOMY W/ SENTINEL NODE BIOPSY: SHX2001

## 2017-10-13 HISTORY — DX: Other intervertebral disc degeneration, lumbar region: M51.36

## 2017-10-13 LAB — POCT PREGNANCY, URINE: Preg Test, Ur: NEGATIVE

## 2017-10-13 SURGERY — MASTECTOMY WITH SENTINEL LYMPH NODE BIOPSY
Anesthesia: General | Site: Breast | Laterality: Bilateral

## 2017-10-13 MED ORDER — TECHNETIUM TC 99M SULFUR COLLOID FILTERED
1.0000 | Freq: Once | INTRAVENOUS | Status: AC | PRN
  Administered 2017-10-13: 1 via INTRADERMAL

## 2017-10-13 MED ORDER — METHOCARBAMOL 500 MG PO TABS
500.0000 mg | ORAL_TABLET | Freq: Four times a day (QID) | ORAL | Status: DC | PRN
  Administered 2017-10-13: 500 mg via ORAL
  Filled 2017-10-13: qty 1

## 2017-10-13 MED ORDER — SODIUM CHLORIDE 0.9 % IJ SOLN
INTRAVENOUS | Status: DC | PRN
  Administered 2017-10-13: 12:00:00

## 2017-10-13 MED ORDER — PHENYLEPHRINE-ACETAMINOPHEN 5-325 MG PO TABS: ORAL_TABLET | ORAL | Status: DC | PRN

## 2017-10-13 MED ORDER — ONDANSETRON HCL 4 MG/2ML IJ SOLN
4.0000 mg | Freq: Four times a day (QID) | INTRAMUSCULAR | Status: DC | PRN
  Administered 2017-10-13: 4 mg via INTRAVENOUS
  Filled 2017-10-13: qty 2

## 2017-10-13 MED ORDER — GABAPENTIN 300 MG PO CAPS
300.0000 mg | ORAL_CAPSULE | ORAL | Status: AC
  Administered 2017-10-13: 300 mg via ORAL
  Filled 2017-10-13: qty 1

## 2017-10-13 MED ORDER — BUPIVACAINE-EPINEPHRINE (PF) 0.5% -1:200000 IJ SOLN
INTRAMUSCULAR | Status: DC | PRN
  Administered 2017-10-13 (×2): 20 mL via PERINEURAL

## 2017-10-13 MED ORDER — DEXAMETHASONE SODIUM PHOSPHATE 10 MG/ML IJ SOLN
INTRAMUSCULAR | Status: DC | PRN
  Administered 2017-10-13: 10 mg via INTRAVENOUS

## 2017-10-13 MED ORDER — FENTANYL CITRATE (PF) 100 MCG/2ML IJ SOLN
INTRAMUSCULAR | Status: DC | PRN
  Administered 2017-10-13: 50 ug via INTRAVENOUS

## 2017-10-13 MED ORDER — 0.9 % SODIUM CHLORIDE (POUR BTL) OPTIME
TOPICAL | Status: DC | PRN
  Administered 2017-10-13 (×2): 1000 mL

## 2017-10-13 MED ORDER — SENNA 8.6 MG PO TABS
1.0000 | ORAL_TABLET | Freq: Two times a day (BID) | ORAL | Status: DC
  Administered 2017-10-13 – 2017-10-14 (×2): 8.6 mg via ORAL
  Filled 2017-10-13 (×2): qty 1

## 2017-10-13 MED ORDER — METOCLOPRAMIDE HCL 5 MG/ML IJ SOLN
INTRAMUSCULAR | Status: AC
  Filled 2017-10-13: qty 2

## 2017-10-13 MED ORDER — METHYLENE BLUE 0.5 % INJ SOLN
INTRAVENOUS | Status: AC
  Filled 2017-10-13: qty 10

## 2017-10-13 MED ORDER — HYDROMORPHONE HCL 1 MG/ML IJ SOLN
1.0000 mg | INTRAMUSCULAR | Status: DC | PRN
  Administered 2017-10-13: 1 mg via INTRAVENOUS
  Filled 2017-10-13: qty 1

## 2017-10-13 MED ORDER — GABAPENTIN 300 MG PO CAPS
300.0000 mg | ORAL_CAPSULE | Freq: Two times a day (BID) | ORAL | Status: DC
  Administered 2017-10-13 – 2017-10-14 (×2): 300 mg via ORAL
  Filled 2017-10-13 (×2): qty 1

## 2017-10-13 MED ORDER — FENTANYL CITRATE (PF) 100 MCG/2ML IJ SOLN
25.0000 ug | INTRAMUSCULAR | Status: DC | PRN
  Administered 2017-10-13 (×2): 50 ug via INTRAVENOUS

## 2017-10-13 MED ORDER — LIDOCAINE 2% (20 MG/ML) 5 ML SYRINGE
INTRAMUSCULAR | Status: DC | PRN
  Administered 2017-10-13: 20 mg via INTRAVENOUS

## 2017-10-13 MED ORDER — ENOXAPARIN SODIUM 40 MG/0.4ML ~~LOC~~ SOLN: 40.0000 mg | SUBCUTANEOUS | Status: DC

## 2017-10-13 MED ORDER — TRAMADOL HCL 50 MG PO TABS: 50.0000 mg | ORAL_TABLET | Freq: Four times a day (QID) | ORAL | Status: DC | PRN

## 2017-10-13 MED ORDER — EPHEDRINE SULFATE-NACL 50-0.9 MG/10ML-% IV SOSY
PREFILLED_SYRINGE | INTRAVENOUS | Status: DC | PRN
  Administered 2017-10-13 (×10): 5 mg via INTRAVENOUS

## 2017-10-13 MED ORDER — OXYMETAZOLINE HCL 0.05 % NA SOLN: 1.0000 | Freq: Every evening | NASAL | Status: DC | PRN

## 2017-10-13 MED ORDER — HYDROCODONE-ACETAMINOPHEN 7.5-325 MG PO TABS
ORAL_TABLET | ORAL | Status: AC
  Filled 2017-10-13: qty 1

## 2017-10-13 MED ORDER — CELECOXIB 200 MG PO CAPS
200.0000 mg | ORAL_CAPSULE | ORAL | Status: AC
  Administered 2017-10-13: 200 mg via ORAL
  Filled 2017-10-13: qty 1

## 2017-10-13 MED ORDER — PHENYLEPHRINE 40 MCG/ML (10ML) SYRINGE FOR IV PUSH (FOR BLOOD PRESSURE SUPPORT)
PREFILLED_SYRINGE | INTRAVENOUS | Status: AC
  Filled 2017-10-13: qty 10

## 2017-10-13 MED ORDER — ALPRAZOLAM 0.25 MG PO TABS
0.2500 mg | ORAL_TABLET | Freq: Every evening | ORAL | Status: DC | PRN
  Administered 2017-10-13: 0.25 mg via ORAL
  Filled 2017-10-13: qty 1

## 2017-10-13 MED ORDER — MIDAZOLAM HCL 2 MG/2ML IJ SOLN
INTRAMUSCULAR | Status: AC
  Filled 2017-10-13: qty 2

## 2017-10-13 MED ORDER — FENTANYL CITRATE (PF) 100 MCG/2ML IJ SOLN
100.0000 ug | Freq: Once | INTRAMUSCULAR | Status: AC
  Administered 2017-10-13: 100 ug via INTRAVENOUS

## 2017-10-13 MED ORDER — SODIUM CHLORIDE 0.9 % IJ SOLN
INTRAMUSCULAR | Status: AC
  Filled 2017-10-13: qty 10

## 2017-10-13 MED ORDER — FENTANYL CITRATE (PF) 100 MCG/2ML IJ SOLN
INTRAMUSCULAR | Status: AC
  Administered 2017-10-13: 100 ug via INTRAVENOUS
  Filled 2017-10-13: qty 2

## 2017-10-13 MED ORDER — LACTATED RINGERS IV SOLN
INTRAVENOUS | Status: DC
  Administered 2017-10-13: 10:00:00 via INTRAVENOUS

## 2017-10-13 MED ORDER — PROPOFOL 10 MG/ML IV BOLUS
INTRAVENOUS | Status: DC | PRN
  Administered 2017-10-13: 100 mg via INTRAVENOUS
  Administered 2017-10-13: 50 mg via INTRAVENOUS

## 2017-10-13 MED ORDER — VITAMIN D3 125 MCG (5000 UT) PO CAPS: 10000.0000 [IU] | ORAL_CAPSULE | Freq: Every day | ORAL | Status: DC

## 2017-10-13 MED ORDER — METOCLOPRAMIDE HCL 5 MG/ML IJ SOLN
10.0000 mg | Freq: Once | INTRAMUSCULAR | Status: AC | PRN
  Administered 2017-10-13: 10 mg via INTRAVENOUS

## 2017-10-13 MED ORDER — LACTATED RINGERS IV SOLN
INTRAVENOUS | Status: DC
  Administered 2017-10-13 – 2017-10-14 (×2): via INTRAVENOUS

## 2017-10-13 MED ORDER — PROPOFOL 10 MG/ML IV BOLUS
INTRAVENOUS | Status: AC
  Filled 2017-10-13: qty 20

## 2017-10-13 MED ORDER — PHENYLEPHRINE 40 MCG/ML (10ML) SYRINGE FOR IV PUSH (FOR BLOOD PRESSURE SUPPORT)
PREFILLED_SYRINGE | INTRAVENOUS | Status: DC | PRN
  Administered 2017-10-13 (×2): 80 ug via INTRAVENOUS
  Administered 2017-10-13: 40 ug via INTRAVENOUS

## 2017-10-13 MED ORDER — CHLORHEXIDINE GLUCONATE CLOTH 2 % EX PADS: 6.0000 | MEDICATED_PAD | Freq: Once | CUTANEOUS | Status: DC

## 2017-10-13 MED ORDER — MIDAZOLAM HCL 2 MG/2ML IJ SOLN
INTRAMUSCULAR | Status: AC
  Administered 2017-10-13: 2 mg via INTRAVENOUS
  Filled 2017-10-13: qty 2

## 2017-10-13 MED ORDER — FENTANYL CITRATE (PF) 250 MCG/5ML IJ SOLN
INTRAMUSCULAR | Status: AC
  Filled 2017-10-13: qty 5

## 2017-10-13 MED ORDER — VITAMIN B-12 1000 MCG PO TABS: 6000.0000 ug | ORAL_TABLET | Freq: Every day | ORAL | Status: DC

## 2017-10-13 MED ORDER — OXYCODONE-ACETAMINOPHEN 5-325 MG PO TABS: 1.0000 | ORAL_TABLET | ORAL | Status: DC | PRN

## 2017-10-13 MED ORDER — LETROZOLE 2.5 MG PO TABS
2.5000 mg | ORAL_TABLET | Freq: Every day | ORAL | Status: DC
  Administered 2017-10-13: 2.5 mg via ORAL
  Filled 2017-10-13: qty 1

## 2017-10-13 MED ORDER — CELECOXIB 200 MG PO CAPS
200.0000 mg | ORAL_CAPSULE | Freq: Two times a day (BID) | ORAL | Status: DC
  Administered 2017-10-13 – 2017-10-14 (×2): 200 mg via ORAL
  Filled 2017-10-13 (×2): qty 1

## 2017-10-13 MED ORDER — CEFAZOLIN SODIUM-DEXTROSE 2-4 GM/100ML-% IV SOLN
2.0000 g | INTRAVENOUS | Status: AC
  Administered 2017-10-13: 2 g via INTRAVENOUS
  Filled 2017-10-13: qty 100

## 2017-10-13 MED ORDER — FENTANYL CITRATE (PF) 100 MCG/2ML IJ SOLN
INTRAMUSCULAR | Status: AC
  Filled 2017-10-13: qty 2

## 2017-10-13 MED ORDER — HYDROCODONE-ACETAMINOPHEN 7.5-325 MG PO TABS
1.0000 | ORAL_TABLET | Freq: Once | ORAL | Status: AC | PRN
  Administered 2017-10-13: 1 via ORAL

## 2017-10-13 MED ORDER — DEXAMETHASONE SODIUM PHOSPHATE 10 MG/ML IJ SOLN
INTRAMUSCULAR | Status: AC
  Filled 2017-10-13: qty 1

## 2017-10-13 MED ORDER — ONDANSETRON HCL 4 MG/2ML IJ SOLN
INTRAMUSCULAR | Status: DC | PRN
  Administered 2017-10-13: 4 mg via INTRAVENOUS

## 2017-10-13 MED ORDER — MEPERIDINE HCL 50 MG/ML IJ SOLN: 6.2500 mg | INTRAMUSCULAR | Status: DC | PRN

## 2017-10-13 MED ORDER — ONDANSETRON 4 MG PO TBDP: 4.0000 mg | ORAL_TABLET | Freq: Four times a day (QID) | ORAL | Status: DC | PRN

## 2017-10-13 MED ORDER — EPHEDRINE 5 MG/ML INJ
INTRAVENOUS | Status: AC
  Filled 2017-10-13: qty 10

## 2017-10-13 MED ORDER — MIDAZOLAM HCL 2 MG/2ML IJ SOLN
2.0000 mg | Freq: Once | INTRAMUSCULAR | Status: AC
  Administered 2017-10-13: 2 mg via INTRAVENOUS

## 2017-10-13 MED ORDER — ONDANSETRON HCL 4 MG/2ML IJ SOLN
INTRAMUSCULAR | Status: AC
  Filled 2017-10-13: qty 2

## 2017-10-13 SURGICAL SUPPLY — 49 items
APPLIER CLIP 9.375 MED OPEN (MISCELLANEOUS) ×2
APR CLP MED 9.3 20 MLT OPN (MISCELLANEOUS) ×1
BINDER BREAST LRG (GAUZE/BANDAGES/DRESSINGS) IMPLANT
BINDER BREAST XLRG (GAUZE/BANDAGES/DRESSINGS) ×2 IMPLANT
CANISTER SUCT 3000ML PPV (MISCELLANEOUS) ×2 IMPLANT
CHLORAPREP W/TINT 26ML (MISCELLANEOUS) ×2 IMPLANT
CLIP APPLIE 9.375 MED OPEN (MISCELLANEOUS) ×1 IMPLANT
CONT SPEC 4OZ CLIKSEAL STRL BL (MISCELLANEOUS) ×4 IMPLANT
COVER PROBE W GEL 5X96 (DRAPES) ×2 IMPLANT
COVER SURGICAL LIGHT HANDLE (MISCELLANEOUS) ×2 IMPLANT
COVER WAND RF STERILE (DRAPES) ×2 IMPLANT
DERMABOND ADVANCED (GAUZE/BANDAGES/DRESSINGS) ×1
DERMABOND ADVANCED .7 DNX12 (GAUZE/BANDAGES/DRESSINGS) ×1 IMPLANT
DEVICE DISSECT PLASMABLAD 3.0S (MISCELLANEOUS) IMPLANT
DRAIN CHANNEL 19F RND (DRAIN) ×4 IMPLANT
DRAPE CHEST BREAST 15X10 FENES (DRAPES) ×2 IMPLANT
DRAPE HALF SHEET 40X57 (DRAPES) ×2 IMPLANT
DRAPE ORTHO SPLIT 87X125 STRL (DRAPES) IMPLANT
ELECT BLADE 4.0 EZ CLEAN MEGAD (MISCELLANEOUS) ×2
ELECT CAUTERY BLADE 6.4 (BLADE) ×2 IMPLANT
ELECT REM PT RETURN 9FT ADLT (ELECTROSURGICAL) ×2
ELECTRODE BLDE 4.0 EZ CLN MEGD (MISCELLANEOUS) ×1 IMPLANT
ELECTRODE REM PT RTRN 9FT ADLT (ELECTROSURGICAL) ×1 IMPLANT
EVACUATOR SILICONE 100CC (DRAIN) ×4 IMPLANT
GLOVE EUDERMIC 7 POWDERFREE (GLOVE) ×2 IMPLANT
GOWN STRL REUS W/ TWL LRG LVL3 (GOWN DISPOSABLE) ×3 IMPLANT
GOWN STRL REUS W/ TWL XL LVL3 (GOWN DISPOSABLE) ×1 IMPLANT
GOWN STRL REUS W/TWL LRG LVL3 (GOWN DISPOSABLE) ×3
GOWN STRL REUS W/TWL XL LVL3 (GOWN DISPOSABLE) ×1
KIT BASIN OR (CUSTOM PROCEDURE TRAY) ×2 IMPLANT
KIT TURNOVER KIT B (KITS) ×2 IMPLANT
NEEDLE 18GX1X1/2 (RX/OR ONLY) (NEEDLE) ×2 IMPLANT
NEEDLE FILTER BLUNT 18X 1/2SAF (NEEDLE) ×1
NEEDLE FILTER BLUNT 18X1 1/2 (NEEDLE) ×1 IMPLANT
NEEDLE HYPO 25GX1X1/2 BEV (NEEDLE) ×2 IMPLANT
NS IRRIG 1000ML POUR BTL (IV SOLUTION) ×2 IMPLANT
PACK GENERAL/GYN (CUSTOM PROCEDURE TRAY) ×2 IMPLANT
PAD ABD 8X10 STRL (GAUZE/BANDAGES/DRESSINGS) ×4 IMPLANT
PAD ARMBOARD 7.5X6 YLW CONV (MISCELLANEOUS) ×2 IMPLANT
PLASMABLADE 3.0S (MISCELLANEOUS)
SPECIMEN JAR X LARGE (MISCELLANEOUS) ×4 IMPLANT
SUT ETHILON 3 0 FSL (SUTURE) ×4 IMPLANT
SUT MNCRL AB 4-0 PS2 18 (SUTURE) ×2 IMPLANT
SUT SILK 2 0 PERMA HAND 18 BK (SUTURE) ×2 IMPLANT
SUT VIC AB 3-0 SH 18 (SUTURE) ×4 IMPLANT
SUT VICRYL AB 2 0 TIES (SUTURE) ×2 IMPLANT
SYR CONTROL 10ML LL (SYRINGE) ×2 IMPLANT
TOWEL OR 17X24 6PK STRL BLUE (TOWEL DISPOSABLE) ×2 IMPLANT
TOWEL OR 17X26 10 PK STRL BLUE (TOWEL DISPOSABLE) ×2 IMPLANT

## 2017-10-13 NOTE — Interval H&P Note (Signed)
History and Physical Interval Note:  10/13/2017 9:27 AM  Sabrina Spencer  has presented today for surgery, with the diagnosis of RIGHT BREAST CANCER  The various methods of treatment have been discussed with the patient and family. After consideration of risks, benefits and other options for treatment, the patient has consented to  Procedure(s): BILATERAL MASTECTOMIES WITH RIGHT SENTINEL LYMPH NODE BIOPSY (Bilateral) as a surgical intervention .  The patient's history has been reviewed, patient examined, no change in status, stable for surgery.  I have reviewed the patient's chart and labs.  Questions were answered to the patient's satisfaction.     [name]

## 2017-10-13 NOTE — Anesthesia Procedure Notes (Signed)
Anesthesia Regional Block: Pectoralis block   Pre-Anesthetic Checklist: ,, timeout performed, Correct Patient, Correct Site, Correct Laterality, Correct Procedure, Correct Position, site marked, Risks and benefits discussed,  Surgical consent,  Pre-op evaluation,  At surgeon's request and post-op pain management  Laterality: Right  Prep: chloraprep       Needles:  Injection technique: Single-shot  Needle Type: Echogenic Stimulator Needle     Needle Length: 9cm  Needle Gauge: 21   Needle insertion depth: 7 cm   Additional Needles:   Procedures:,,,, ultrasound used (permanent image in chart),,,,  Narrative:  Start time: 10/13/2017 10:00 AM End time: 10/13/2017 10:05 AM Injection made incrementally with aspirations every 5 mL.  Performed by: Personally  Anesthesiologist: Josephine Igo, MD  Additional Notes: Timeout performed. Patient sedated. Relevant anatomy ID'd using [place]. Incremental 2-39ml injection of LA with frequent aspiration. Patient tolerated procedure well.        Right Pectoralis Block

## 2017-10-13 NOTE — Progress Notes (Signed)
Arrived to 6n24 from PACU at th is time. C/O nausea, denies pain at this time. Family at bedside

## 2017-10-13 NOTE — Anesthesia Postprocedure Evaluation (Signed)
Anesthesia Post Note  Patient: Sabrina Spencer  Procedure(s) Performed: BILATERAL MASTECTOMIES WITH RIGHT SENTINEL LYMPH NODE BIOPSY (Bilateral Breast)     Patient location during evaluation: PACU Anesthesia Type: General Level of consciousness: awake and alert, oriented and patient cooperative Pain management: pain level controlled Vital Signs Assessment: post-procedure vital signs reviewed and stable Respiratory status: spontaneous breathing, nonlabored ventilation, respiratory function stable and patient connected to nasal cannula oxygen Cardiovascular status: blood pressure returned to baseline and stable Postop Assessment: no apparent nausea or vomiting Anesthetic complications: no    Last Vitals:  Vitals:   10/13/17 1435 10/13/17 1509  BP: 126/83 120/84  Pulse: 98 94  Resp: 14 18  Temp:    SpO2: 96%     Last Pain:  Vitals:   10/13/17 1510  TempSrc:   PainSc: 0-No pain                 Yvon Mccord,E. Erielle Gawronski

## 2017-10-13 NOTE — Anesthesia Preprocedure Evaluation (Signed)
Anesthesia Evaluation  Patient identified by MRN, date of birth, ID band Patient awake    Reviewed: Allergy & Precautions, NPO status , Patient's Chart, lab work & pertinent test results  Airway Mallampati: II  TM Distance: >3 FB Neck ROM: Full    Dental no notable dental hx. (+) Teeth Intact   Pulmonary neg pulmonary ROS,    Pulmonary exam normal breath sounds clear to auscultation       Cardiovascular negative cardio ROS Normal cardiovascular exam Rhythm:Regular Rate:Normal     Neuro/Psych negative neurological ROS  negative psych ROS   GI/Hepatic negative GI ROS, Neg liver ROS,   Endo/Other  Right Breast Ca  Renal/GU negative Renal ROS  negative genitourinary   Musculoskeletal  (+) Arthritis , Osteoarthritis,    Abdominal   Peds  Hematology  (+) anemia ,   Anesthesia Other Findings   Reproductive/Obstetrics                             Anesthesia Physical Anesthesia Plan  ASA: II  Anesthesia Plan: General   Post-op Pain Management:  Regional for Post-op pain   Induction: Intravenous  PONV Risk Score and Plan: 3 and Midazolam, Ondansetron, Dexamethasone and Treatment may vary due to age or medical condition  Airway Management Planned: LMA  Additional Equipment:   Intra-op Plan:   Post-operative Plan: Extubation in OR  Informed Consent: I have reviewed the patients History and Physical, chart, labs and discussed the procedure including the risks, benefits and alternatives for the proposed anesthesia with the patient or authorized representative who has indicated his/her understanding and acceptance.   Dental advisory given  Plan Discussed with: Surgeon and CRNA  Anesthesia Plan Comments:         Anesthesia Quick Evaluation

## 2017-10-13 NOTE — Op Note (Addendum)
Patient Name:           Sabrina Spencer   Date of Surgery:        10/13/2017  Pre op Diagnosis:      Cancer right breast  Post op Diagnosis:    Cancer right breast  Procedure:                 Inject blue dye right breast                                      Right total mastectomy                                      Right axillary deep sentinel lymph node biopsy                                       Left prophylactic mastectomy  Surgeon:                     Edsel Petrin. Dalbert Batman, M.D., FACS  Assistant:                     Sharyn Dross, RNFA   Indication for Assistant: Assist with exposure and expedite case  Operative Indications:   This is a 47 year old female who is brought to the operating room electively for surgery for her right breast cancer.. Her oncologist is Dr. Lindi Adie. She has seen Dr. Sondra Come. She has now seen 3 plastic surgeons in [place],.      She recently noticed a breast mass in the right breast and imaging studies showed a 4.1 cm mass by mammogram and a 4.7 cm mass by ultrasound right breast, 10 o'clock position. Abnormal lymph nodes were suggested. Left breast looked normal. Category D density. 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 ER 60%. PR 60%. HER-2 negative. Ki-67 5%. Breast MRI shows 4.5 cm cancer in the upper outer right breast but there is irregular non-mass enhancement extending anteriorly and inferiorly making this suspicious area 7.5 cm. Further tissue biopsies versus suggested if necessary. The left breast looked normal. There were no abnormal lymph nodes by MRI      Bone scan and CT scan showed some subtle findings thought to be benign. Follow-up in 3 months warranted. This has been evaluated by Dr. Lindi Adie.   Oncotype is planned.  She has been started on neoadjuvant letrozole. She did not qualify for  genetics.      She is concerned about implant-based reconstruction. She is fearful that this will impair MRI imaging. She has decided that she wants bilateral mastectomies without reconstruction at this time. She states that she would like to complete radiation therapy and any further interventions that are necessary and then consider autologous tissue transfer such as TRAM or DIEP flap [facility] the road. I told her that was reasonable and that is what we will do       She will be scheduled for injection of blue dye right breast, right total mastectomy, right axillary deep Sentinel  lymph node biopsy, left prophylactic mastectomy..       Following surgery she will undergo adjuvant radiation therapy followed by adjuvant antiestrogen therapy with letrozole for 7 years.  Oncotype testing is planned. Marland Kitchen  Operative Findings:       The left prophylactic mastectomy was uneventful and there were no grossly abnormal findings.  Right breast contained a palpable mass but it did not appear to involve the skin or the pectoralis muscle.  I found 3 or 4 sentinel lymph nodes that were hot and blue and these were sent as a single specimen.  Procedure in Detail:          Bilateral pectoral blocks and injection of radionuclide into the right breast was performed in the holding area.  The patient was brought to the operating room underwent general anesthesia with LMA device.  Surgical timeout was performed.  Intravenous antibiotics were given.  Following alcohol prep I injected 5 cc of dilute methylene blue into the right breast subareolar area and massaged the breast for a few minutes.  The neck, entire chest, upper abdomen, and bilateral axilla were then prepped and draped in sterile fashion.     I carefully planned and drew symmetrical, bilateral, transverse elliptical incisions.  The left mastectomy was performed first.  The incision was made with the knife.  Skin flaps were raised superiorly  to the inferior clavicular area, medially to the parasternal area, inferiorly to the anterior rectus sheath and laterally to the latissimus dorsi muscle.  The breast tissues dissected off of the pectoralis major and minor muscles.  The lateral skin was marked with a silk suture.  I dissected the tail of Spence with the specimen and sent the specimen to the lab.  Hemostasis was excellent and achieved with  electrocautery.  The wound was irrigated and packed.    Attention was directed to the right mastectomy.  Mirror-image transverse elliptical incision was made.  Skin flaps were raised superiorly, medially, inferiorly, and laterally in similar fashion.  We dissected the clavipectoral fascia and took a few sentinel lymph nodes out.  Some of these were matted together and so I could not tell exactly whether I took 3, 4 or 5 lymph nodes.  They were sent as a single specimen.  The right breast was then dissected off the pectoralis major and minor muscles and  the tail of Spence was dissected.  Axillary attachments were 4 clamped, divided, and ligated with 2-0 Vicryl ties.  Hemostasis was excellent and achieved with electrocautery and some metal clips.  The right mastectomy wound was irrigated.  We carefully checked for hemostasis on both sides.  It seemed quite hemostatic    Two 19 French Blake drains were placed on each side, one up toward the axilla and one across the skin flaps.  These were brought out through separate stab incisions inferolaterally and sutured to the skin with nylon sutures.  Both mastectomy incisions were closed transversely in 2 layers.  Subcutaneous tissue was closed with interrupted 3-0 Vicryl sutures and the skin closed with running subcuticular 4-0 Monocryl and Dermabond.  The drains were  functioning well and had a good charge.  There was no air leak.  The wounds were cleansed, covered with dry gauze bandage and a breast binder.  The patient tolerated the procedure well was taken to PACU in  stable condition.  EBL 200 cc.  Counts correct.  Complications none.   Addendum: I logged onto the [employer] and reviewed her prescription  medication history.     Edsel Petrin. Dalbert Batman, M.D., FACS General and Minimally Invasive Surgery Breast and Colorectal Surgery  10/13/2017 1:16 PM

## 2017-10-13 NOTE — Transfer of Care (Signed)
Immediate Anesthesia Transfer of Care Note  Patient: Sabrina Spencer  Procedure(s) Performed: BILATERAL MASTECTOMIES WITH RIGHT SENTINEL LYMPH NODE BIOPSY (Bilateral Breast)  Patient Location: PACU  Anesthesia Type:General with regional block  Level of Consciousness: awake and alert   Airway & Oxygen Therapy: Patient Spontanous Breathing and Patient connected to nasal cannula oxygen  Post-op Assessment: Report given to RN and Post -op Vital signs reviewed and stable  Post vital signs: Reviewed and stable  Last Vitals:  Vitals Value Taken Time  BP 118/84 10/13/2017  1:39 PM  Temp    Pulse 111 10/13/2017  1:41 PM  Resp 11 10/13/2017  1:41 PM  SpO2 100 % 10/13/2017  1:41 PM  Vitals shown include unvalidated device data.  Last Pain:  Vitals:   10/13/17 0913  TempSrc:   PainSc: 4       Patients Stated Pain Goal: 3 (03/20/92 5859)  Complications: No apparent anesthesia complications

## 2017-10-13 NOTE — Anesthesia Procedure Notes (Signed)
Anesthesia Regional Block: Pectoralis block   Pre-Anesthetic Checklist: ,, timeout performed, Correct Patient, Correct Site, Correct Laterality, Correct Procedure, Correct Position, site marked, Risks and benefits discussed,  Surgical consent,  Pre-op evaluation,  At surgeon's request and post-op pain management  Laterality: Left  Prep: chloraprep       Needles:  Injection technique: Single-shot  Needle Type: Echogenic Stimulator Needle     Needle Length: 9cm  Needle Gauge: 21   Needle insertion depth: 7 cm   Additional Needles:   Procedures:,,,, ultrasound used (permanent image in chart),,,,  Narrative:  Start time: 10/13/2017 10:05 AM End time: 10/13/2017 10:10 AM Injection made incrementally with aspirations every 5 mL.  Performed by: Personally  Anesthesiologist: Josephine Igo, MD  Additional Notes: Timeout performed. Patient sedated. Relevant anatomy ID'd using [place]. Incremental 2-59ml injection of LA with frequent aspiration. Patient tolerated procedure well.        Left Pectoralis Block

## 2017-10-13 NOTE — Anesthesia Procedure Notes (Signed)
Procedure Name: LMA Insertion Date/Time: 10/13/2017 10:38 AM Performed by: Colin Benton, CRNA Pre-anesthesia Checklist: Patient identified, Emergency Drugs available, Suction available and Patient being monitored Patient Re-evaluated:Patient Re-evaluated prior to induction Oxygen Delivery Method: Circle System Utilized Preoxygenation: Pre-oxygenation with 100% oxygen Induction Type: IV induction Ventilation: Mask ventilation without difficulty LMA: LMA inserted LMA Size: 4.0 Number of attempts: 1 Placement Confirmation: positive ETCO2 and breath sounds checked- equal and bilateral Tube secured with: Tape Dental Injury: Teeth and Oropharynx as per pre-operative assessment

## 2017-10-14 ENCOUNTER — Encounter (HOSPITAL_COMMUNITY): Payer: Self-pay | Admitting: General Surgery

## 2017-10-14 DIAGNOSIS — C50411 Malignant neoplasm of upper-outer quadrant of right female breast: Secondary | ICD-10-CM | POA: Diagnosis not present

## 2017-10-14 LAB — CBC
HCT: 37.8 % (ref 36.0–46.0)
Hemoglobin: 12.2 g/dL (ref 12.0–15.0)
MCH: 31.4 pg (ref 26.0–34.0)
MCHC: 32.3 g/dL (ref 30.0–36.0)
MCV: 97.2 fL (ref 80.0–100.0)
Platelets: 249 10*3/uL (ref 150–400)
RBC: 3.89 MIL/uL (ref 3.87–5.11)
RDW: 12.5 % (ref 11.5–15.5)
WBC: 12.6 10*3/uL — ABNORMAL HIGH (ref 4.0–10.5)
nRBC: 0 % (ref 0.0–0.2)

## 2017-10-14 LAB — CREATININE, SERUM
Creatinine, Ser: 0.66 mg/dL (ref 0.44–1.00)
GFR calc Af Amer: >60 mL/min
GFR calc non Af Amer: >60 mL/min

## 2017-10-14 MED ORDER — HYDROCODONE-ACETAMINOPHEN 5-325 MG PO TABS: 1.0000 | ORAL_TABLET | Freq: Four times a day (QID) | ORAL | 0 refills | Status: DC | PRN

## 2017-10-14 NOTE — Progress Notes (Signed)
Discussed pt discharge information with pt and pt husband discussed site care and dressing to site area must be dry dressing, discussed medications and follow up appointments, discussed knowledge of pt and pt husband about drain and drain care for all 4 JP drains pt and pt husband showed self demonstration on how to empty drain and place self suction back on drains, both verbalized understanding of all information that was given. Discussed all restrictions on pt by MD. Pt walked out with husband did not want wheelchair.

## 2017-10-14 NOTE — Discharge Summary (Signed)
Patient ID: Sabrina Spencer [id number] y.o. 1970-10-29  Admit date: 10/13/2017  Discharge date and time: 10/14/2017  Admitting Physician: [name]  Discharge Physician: [name]  Admission Diagnoses: RIGHT BREAST CANCER  Discharge Diagnoses: Right breast cancer  Operations: Procedure(s): Inject blue dye right breast Right total mastectomy Right axillary deep sentinel lymph node biopsy Left prophylactic total mastectomy  Admission Condition: good  Discharged Condition: good  Indication for Admission: This is a 47 year old female who is brought to the operating room electively for surgery for her right breast cancer.. Her oncologist is Dr. Lindi Adie. She has seen Dr. Sondra Come. She has now seen 3 plastic surgeons in [place],. She recently noticed a breast mass in the right breast and imaging studies showed a 4.1 cm mass by mammogram and a 4.7 cm mass by ultrasound right breast, 10 o'clock position. Abnormal lymph nodes were suggested. Left breast looked normal. Category D density. 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 ER 60%. PR 60%. HER-2 negative. Ki-67 5%. Breast MRI shows 4.5 cm cancer in the upper outer right breast but there is irregular non-mass enhancement extending anteriorly and inferiorly making this suspicious area 7.5 cm. Further tissue biopsies versus suggested if necessary. The left breast looked normal. There were no abnormal lymph nodes by MRI Bone scan and CT scan showed some subtle findings thought to be benign. Follow-up in 3 months warranted. This has been evaluated by Dr. Lindi Adie.   Oncotype is planned.  She has been started on neoadjuvant letrozole. She did not qualify for genetics. She is concerned about implant-based reconstruction. She is fearful that this will impair  MRI imaging. She has decided that she wants bilateral mastectomies without reconstruction at this time. She states that she would like to complete radiation therapy and any further interventions that are necessary and then consider autologous tissue transfer such as TRAM or DIEP flap at a local university program  down the road. I told her that was reasonable and that is what we will do She will be scheduled for injection of blue dye right breast, right total mastectomy, right axillary deep Sentinel lymph node biopsy, left prophylactic mastectomy.. Following surgery she will undergo adjuvant radiation therapy followed by adjuvant antiestrogen therapy with letrozole for 7 years.  Oncotype testing is planned.  Hospital Course: On the day of admission she underwent bilateral total mastectomies and right axillary sentinel lymph node biopsy.  The surgery was uneventful.  The tumor in the right breast was palpable but was not invading skin or muscle.  We found 3-5 sentinel lymph nodes, some of which were matted together.      The patient was observed overnight.  She had some nausea and vomiting in the afternoon following surgery but that resolved.  On postop day 1 she felt well and looked well and wanted to go home.  We decided to wait until she had breakfast to make sure that she had no problems with nausea.  She was ambulatory and voiding without difficulty.  She had minimal pain.  Examination of her wounds reveal that both mastectomy skin flaps were very healthy.  There is no hematoma or seroma.  No ischemia.  All 4 drains were functioning with serosanguineous drainage.     She was given instructions in diet, activities, drain care, and recordkeeping.  She will use Tylenol or Advil for pain.  I  did call in a prescription for Norco to her pharmacy if needed for back-up.  She will return to see me in the office in 10 to 14 days.  I will call the pathology report to her when it is available, hopefully  by the end of the week.  She has follow-up arranged with Dr. Lindi Adie  as well.  Consults: None  Significant Diagnostic Studies: Surgical pathology  Treatments: surgery: Bilateral total mastectomy.  Right sentinel lymph node biopsy  Disposition: Home  Patient Instructions:  Allergies as of 10/14/2017   No Known Allergies     Medication List    TAKE these medications   acetaminophen 500 MG tablet Commonly known as:  TYLENOL Take 1,000 mg by mouth every 6 (six) hours as needed for moderate pain or headache.   ALPRAZolam 0.5 MG tablet Commonly known as:  XANAX Take 0.25 mg by mouth at bedtime as needed for sleep.   cyanocobalamin 2000 MCG tablet Take 6,000 mcg by mouth daily.   HYDROcodone-acetaminophen 5-325 MG tablet Commonly known as:  NORCO/VICODIN Take 1-2 tablets by mouth every 6 (six) hours as needed for moderate pain or severe pain.   letrozole 2.5 MG tablet Commonly known as:  FEMARA Take 1 tablet (2.5 mg total) by mouth daily. What changed:  when to take this   oxymetazoline 0.05 % nasal spray Commonly known as:  AFRIN Place 1 spray into both nostrils at bedtime as needed for congestion.   SINUS HEADACHE PE MAX ST PO Take 2 tablets by mouth every 4 (four) hours as needed (sinus headaches).   Vitamin D3 5000 units Caps Take 10,000 Units by mouth daily.            Discharge Care Instructions  (From admission, onward)         Start     Ordered   10/14/17 0000  Discharge wound care:    Comments:  Empty and recharge all 4 drains as instructed Keep a written record of the drainage Bring the drainage record to the office with you each time  The wound should be covered with dry gauze bandages.  No ointments  You may take a quick shower, starting Friday No tub bath  Move both shoulders around frequently to avoid stiffness   10/14/17 0618          Activity: Frequent ambulation.  No driving.  Shoulder range of motion exercises. Diet: regular  diet Wound Care: as directed  Follow-up:  With Dr. Dalbert Batman in 2 weeks    Addendum: I logged onto the [facility] website and reviewed his prescription medication history  .  Signed: Edsel Petrin. Dalbert Batman, M.D., FACS General and minimally invasive surgery Breast and Colorectal Surgery  10/14/2017, 6:21 AM

## 2017-10-14 NOTE — Discharge Instructions (Signed)
[facility] °[phone] ° °MASTECTOMY: POST OP INSTRUCTIONS ° °Always review your discharge instruction sheet given to you by the facility where your surgery was performed. °IF YOU HAVE DISABILITY OR FAMILY LEAVE FORMS, YOU MUST BRING THEM TO THE OFFICE FOR PROCESSING.   °DO NOT GIVE THEM TO YOUR DOCTOR. °A prescription for pain medication may be given to you upon discharge.  Take your pain medication as prescribed, if needed.  If narcotic pain medicine is not needed, then you may take acetaminophen (Tylenol) or ibuprofen (Advil) as needed. °1. Take your usually prescribed medications unless otherwise directed. °2. If you need a refill on your pain medication, please contact your pharmacy.  They will contact our office to request authorization.  Prescriptions will not be filled after 5pm or on week-ends. °3. You should follow a light diet the first few days after arrival home, such as soup and crackers, etc.  Resume your normal diet the day after surgery. °4. Most patients will experience some swelling and bruising on the chest and underarm.  Ice packs will help.  Swelling and bruising can take several days to resolve.  °5. It is common to experience some constipation if taking pain medication after surgery.  Increasing fluid intake and taking a stool softener (such as Colace) will usually help or prevent this problem from occurring.  A mild laxative (Milk of Magnesia or Miralax) should be taken according to package instructions if there are no bowel movements after 48 hours. °6. Unless discharge instructions indicate otherwise, leave your bandage dry and in place until your next appointment in 3-5 days.  You may take a limited sponge bath.  No tube baths or showers until the drains are removed.  You may have steri-strips (small skin tapes) in place directly over the incision.  These strips should be left on the skin for 7-10 days.  If your surgeon used skin glue on the incision, you may  shower in 24 hours.  The glue will flake off over the next 2-3 weeks.  Any sutures or staples will be removed at the office during your follow-up visit. °7. DRAINS:  If you have drains in place, it is important to keep a list of the amount of drainage produced each day in your drains.  Before leaving the hospital, you should be instructed on drain care.  Call our office if you have any questions about your drains. °8. ACTIVITIES:  You may resume regular (light) daily activities beginning the next day--such as daily self-care, walking, climbing stairs--gradually increasing activities as tolerated.  You may have sexual intercourse when it is comfortable.  Refrain from any heavy lifting or straining until approved by your doctor. °a. You may drive when you are no longer taking prescription pain medication, you can comfortably wear a seatbelt, and you can safely maneuver your car and apply brakes. °b. RETURN TO WORK:  __________________________________________________________ °9. You should see your doctor in the office for a follow-up appointment approximately 3-5 days after your surgery.  Your doctor’s nurse will typically make your follow-up appointment when she calls you with your pathology report.  Expect your pathology report 2-3 business days after your surgery.  You may call to check if you do not hear from us after three days.   °10. OTHER INSTRUCTIONS: ______________________________________________________________________________________________ ____________________________________________________________________________________________ °WHEN TO CALL YOUR DOCTOR: °1. Fever over 101.0 °2. Nausea and/or vomiting °3. Extreme swelling or bruising °4. Continued bleeding from incision. °5. Increased pain, redness, or drainage from the incision. °  The clinic staff is available to answer your questions during regular business hours.  Please don’t hesitate to call and ask to speak to one of the nurses for clinical  concerns.  If you have a medical emergency, go to the nearest emergency room or call 911.  A surgeon from [facility] is always on call at the hospital. °[address], [address], [place], [place]  [address] ? [address] [address], [place], [place]   [address] °[phone] ? [phone] ? FAX [phone] °Web site: [email or web address] °

## 2017-10-20 ENCOUNTER — Telehealth: Payer: Self-pay | Admitting: *Deleted

## 2017-10-20 ENCOUNTER — Inpatient Hospital Stay: Payer: BLUE CROSS/BLUE SHIELD | Attending: Hematology and Oncology | Admitting: Hematology and Oncology

## 2017-10-20 DIAGNOSIS — Z170 Estrogen receptor positive status [ER+]: Secondary | ICD-10-CM | POA: Diagnosis not present

## 2017-10-20 DIAGNOSIS — Z9013 Acquired absence of bilateral breasts and nipples: Secondary | ICD-10-CM

## 2017-10-20 DIAGNOSIS — C773 Secondary and unspecified malignant neoplasm of axilla and upper limb lymph nodes: Secondary | ICD-10-CM | POA: Diagnosis not present

## 2017-10-20 DIAGNOSIS — C50411 Malignant neoplasm of upper-outer quadrant of right female breast: Secondary | ICD-10-CM | POA: Diagnosis present

## 2017-10-20 DIAGNOSIS — Z79899 Other long term (current) drug therapy: Secondary | ICD-10-CM | POA: Insufficient documentation

## 2017-10-20 NOTE — Telephone Encounter (Signed)
Received order for mammaprint testing order. Requisition faxed to pathology and [facility]. Received by Varney Biles.

## 2017-10-20 NOTE — Assessment & Plan Note (Addendum)
10/13/2017:Bilateral mastectomies:  Right mastectomy: IDC grade 2, 4 cm, lymphovascular invasion is present, margins negative, 2/7 lymph nodes are positive, ER 60% moderate to week, PR 60% moderate to weak, HER-2 negative, Ki-67 5%, T2N1A stage IIa Left mastectomy: Benign radial scar  Pathology counseling: I discussed the final pathology report of the patient provided  a copy of this report. I discussed the margins as well as lymph node surgeries. We also discussed the final staging along with previously performed ER/PR and HER-2/neu testing.  CT chest abdomen and pelvis 08/28/2017: Few scattered pulmonary nodules throughout noted throughout the lungs bilaterally 5 mm or less, likely benign follow-up studies recommended 09/24/2017 bone scan and thoracic and lumbar MRI: No evidence of metastatic disease  Treatment plan: 1.  Mammaprint testing to determine if she would benefit from chemotherapy 2. adjuvant radiation therapy 3.  Followed by adjuvant antiestrogen therapy: Will resume letrozole 2.5 mg daily x7 years Patient took neoadjuvant letrozole therapy for 3 months prior to surgery.  Return to clinic based upon Mammaprint test result

## 2017-10-20 NOTE — Progress Notes (Signed)
Patient Care Team: Avon Gully, NP as PCP - General (Obstetrics and Gynecology)  DIAGNOSIS:  Encounter Diagnosis  Name Primary?  . Malignant neoplasm of upper-outer quadrant of right breast in female, estrogen receptor positive ([place])     SUMMARY OF ONCOLOGIC HISTORY:   Malignant neoplasm of upper-outer quadrant of right breast in female, estrogen receptor positive ([place])   07/30/2017 Initial Diagnosis    Palpable nodules in the right breast: 10 o'clock position: U/S 4.1 cm mass, single abnormal lymph node: Biopsy benign, biopsy of the mass revealed IDC with micropapillary features and extravasated mucin, grade 2, EF 60%, PR 60%, Ki-67 5%, HER-2 negative ratio 1.32, MRI revealed additional satellite nodules together measured 7.5 cm, T3 N0 stage II clinical stage AJCC 8    10/13/2017 Surgery    Bilateral mastectomies: Right mastectomy: IDC grade 2, 4 cm, lymphovascular invasion is present, margins negative, 2/7 lymph nodes are positive, ER 60% moderate to week, PR 60% moderate to weak, HER-2 negative, Ki-67 5%, T2N1A stage IB Left mastectomy: Benign radial scar     10/20/2017 Cancer Staging    Staging form: Breast, AJCC 8th Edition - Pathologic: Stage IB (pT2, pN1, cM0, G2, ER+, PR+, HER2-) - Signed by Nicholas Lose, MD on 10/20/2017     CHIEF COMPLIANT: Follow-up after bilateral mastectomies  INTERVAL HISTORY: Sabrina Spencer is a 47 year old with above-mentioned history of right breast cancer who underwent bilateral mastectomies and is here today to discuss the results.  2 out of 7 lymph nodes were positive for breast cancer.  She is recovering very well from the surgeries and has some discomfort in the axilla.  She is here to discuss the adjuvant treatment options.  REVIEW OF SYSTEMS:   Constitutional: Denies fevers, chills or abnormal weight loss Eyes: Denies blurriness of vision Ears, nose, mouth, throat, and face: Denies mucositis or sore throat Respiratory: Denies cough,  dyspnea or wheezes Cardiovascular: Denies palpitation, chest discomfort Gastrointestinal:  Denies nausea, heartburn or change in bowel habits Skin: Denies abnormal skin rashes Lymphatics: Denies new lymphadenopathy or easy bruising Neurological:Denies numbness, tingling or new weaknesses Behavioral/Psych: Mood is stable, no new changes  Extremities: No lower extremity edema Breast: Bilateral mastectomies All other systems were reviewed with the patient and are negative.  I have reviewed the past medical history, past surgical history, social history and family history with the patient and they are unchanged from previous note.  ALLERGIES:  has No Known Allergies.  MEDICATIONS:  Current Outpatient Medications  Medication Sig Dispense Refill  . acetaminophen (TYLENOL) 500 MG tablet Take 1,000 mg by mouth every 6 (six) hours as needed for moderate pain or headache.    . ALPRAZolam (XANAX) 0.5 MG tablet Take 0.25 mg by mouth at bedtime as needed for sleep.  0  . Cholecalciferol (VITAMIN D3) 5000 units CAPS Take 10,000 Units by mouth daily.    . cyanocobalamin 2000 MCG tablet Take 6,000 mcg by mouth daily.    Marland Kitchen HYDROcodone-acetaminophen (NORCO) 5-325 MG tablet Take 1-2 tablets by mouth every 6 (six) hours as needed for moderate pain or severe pain. 20 tablet 0  . letrozole (FEMARA) 2.5 MG tablet Take 1 tablet (2.5 mg total) by mouth daily. (Patient taking differently: Take 2.5 mg by mouth at bedtime. ) 90 tablet 3  . oxymetazoline (AFRIN) 0.05 % nasal spray Place 1 spray into both nostrils at bedtime as needed for congestion.    Marland Kitchen Phenylephrine-Acetaminophen (SINUS HEADACHE PE MAX ST PO) Take 2 tablets by mouth every  4 (four) hours as needed (sinus headaches).     No current facility-administered medications for this visit.     PHYSICAL EXAMINATION: ECOG PERFORMANCE STATUS: 1 - Symptomatic but completely ambulatory  Vitals:   10/20/17 1428  BP: 109/68  Pulse: 83  Resp: 16  Temp: 98.4  F (36.9 C)  SpO2: 100%   Filed Weights   10/20/17 1428  Weight: 150 lb 8 oz (68.3 kg)    GENERAL:alert, no distress and comfortable SKIN: skin color, texture, turgor are normal, no rashes or significant lesions EYES: normal, Conjunctiva are pink and non-injected, sclera clear OROPHARYNX:no exudate, no erythema and lips, buccal mucosa, and tongue normal  NECK: supple, thyroid normal size, non-tender, without nodularity LYMPH:  no palpable lymphadenopathy in the cervical, axillary or inguinal LUNGS: clear to auscultation and percussion with normal breathing effort HEART: regular rate & rhythm and no murmurs and no lower extremity edema ABDOMEN:abdomen soft, non-tender and normal bowel sounds MUSCULOSKELETAL:no cyanosis of digits and no clubbing  NEURO: alert & oriented x 3 with fluent speech, no focal motor/sensory deficits EXTREMITIES: No lower extremity edema    LABORATORY DATA:  I have reviewed the data as listed CMP Latest Ref Rng & Units 10/14/2017 10/06/2017 07/31/2017  Glucose 70 - 99 mg/dL - 94 83  BUN 6 - 20 mg/dL - 14 15  Creatinine 0.44 - 1.00 mg/dL 0.66 0.72 0.79  Sodium 135 - 145 mmol/L - 138 139  Potassium 3.5 - 5.1 mmol/L - 4.3 4.4  Chloride 98 - 111 mmol/L - 106 108  CO2 22 - 32 mmol/L - 25 22  Calcium 8.9 - 10.3 mg/dL - 9.3 9.1  Total Protein 6.5 - 8.1 g/dL - 7.3 -  Total Bilirubin 0.3 - 1.2 mg/dL - 0.7 -  Alkaline Phos 38 - 126 U/L - 51 -  AST 15 - 41 U/L - 24 -  ALT 0 - 44 U/L - 18 -    Lab Results  Component Value Date   WBC 12.6 (H) 10/14/2017   HGB 12.2 10/14/2017   HCT 37.8 10/14/2017   MCV 97.2 10/14/2017   PLT 249 10/14/2017   NEUTROABS 2.4 10/06/2017    ASSESSMENT & PLAN:  Malignant neoplasm of upper-outer quadrant of right breast in female, estrogen receptor positive ([place]) 10/13/2017:Bilateral mastectomies:  Right mastectomy: IDC grade 2, 4 cm, lymphovascular invasion is present, margins negative, 2/7 lymph nodes are positive, ER 60%  moderate to week, PR 60% moderate to weak, HER-2 negative, Ki-67 5%, T2N1A stage IIa Left mastectomy: Benign radial scar  Pathology counseling: I discussed the final pathology report of the patient provided  a copy of this report. I discussed the margins as well as lymph node surgeries. We also discussed the final staging along with previously performed ER/PR and HER-2/neu testing.  CT chest abdomen and pelvis 08/28/2017: Few scattered pulmonary nodules throughout noted throughout the lungs bilaterally 5 mm or less, likely benign follow-up studies recommended 09/24/2017 bone scan and thoracic and lumbar MRI: No evidence of metastatic disease  Treatment plan: 1.  Mammaprint testing to determine if she would benefit from chemotherapy 2. adjuvant radiation therapy 3.  Followed by adjuvant antiestrogen therapy: Will resume letrozole 2.5 mg daily x7 years Patient took neoadjuvant letrozole therapy for 3 months prior to surgery.  Mammaprint counseling: MINDACT is a prospective, randomized phase III controlled trial that investigates the clinical utility of MammaPrint, when compared to standard clinical pathological criteria, with 6,693 patients enrolled from over 111 institutions. Clinical high-risk  patients with a Low Risk MammaPrint result, including 48% node-positive, had 5-year distant metastasis-free survival rate in excess of 94 percent, whether randomized to receive adjuvant chemotherapy or not proving MammaPrint's ability to safely identify Low Risk patients.  Return to clinic based upon Mammaprint test result   No orders of the defined types were placed in this encounter.  The patient has a good understanding of the overall plan. she agrees with it. she will call with any problems that may develop before the next visit here.   Harriette Ohara, MD 10/20/17

## 2017-10-28 ENCOUNTER — Telehealth: Payer: Self-pay | Admitting: *Deleted

## 2017-10-28 ENCOUNTER — Encounter (HOSPITAL_COMMUNITY): Payer: Self-pay | Admitting: Hematology and Oncology

## 2017-10-28 NOTE — Telephone Encounter (Signed)
Received Mammaprint score of low risk. Physician team notified. Called pt and discuss results.

## 2017-11-03 ENCOUNTER — Telehealth: Payer: Self-pay | Admitting: Radiation Oncology

## 2017-11-03 NOTE — Telephone Encounter (Signed)
Pt has called wondering about referral to radiation. I have sent Bary Castilla an inbasket msg to determine if a referral to radiation will be coming.

## 2017-11-04 ENCOUNTER — Telehealth: Payer: Self-pay

## 2017-11-04 ENCOUNTER — Ambulatory Visit: Payer: BLUE CROSS/BLUE SHIELD | Admitting: Rehabilitation

## 2017-11-04 NOTE — Telephone Encounter (Signed)
Pt called to report that she had seen Dr.Ingram yesterday and was told that she will be needing another surgery. Pt was unclear of reason and wants to come in to see Dr.Gudena to discuss this further, since she was under the impression that she was supposed to be referred to radiation, now that she was done with surgery. Scheduled pt tomorrow to meet with Dr.Gudena for further discussion. Pt confirmed time/date.

## 2017-11-05 ENCOUNTER — Inpatient Hospital Stay (HOSPITAL_BASED_OUTPATIENT_CLINIC_OR_DEPARTMENT_OTHER): Payer: BLUE CROSS/BLUE SHIELD | Admitting: Hematology and Oncology

## 2017-11-05 ENCOUNTER — Telehealth: Payer: Self-pay | Admitting: Hematology and Oncology

## 2017-11-05 DIAGNOSIS — R911 Solitary pulmonary nodule: Secondary | ICD-10-CM

## 2017-11-05 DIAGNOSIS — C773 Secondary and unspecified malignant neoplasm of axilla and upper limb lymph nodes: Secondary | ICD-10-CM

## 2017-11-05 DIAGNOSIS — Z170 Estrogen receptor positive status [ER+]: Secondary | ICD-10-CM | POA: Diagnosis not present

## 2017-11-05 DIAGNOSIS — N644 Mastodynia: Secondary | ICD-10-CM

## 2017-11-05 DIAGNOSIS — C50411 Malignant neoplasm of upper-outer quadrant of right female breast: Secondary | ICD-10-CM

## 2017-11-05 DIAGNOSIS — Z9013 Acquired absence of bilateral breasts and nipples: Secondary | ICD-10-CM

## 2017-11-05 NOTE — Assessment & Plan Note (Signed)
10/13/2017:Bilateral mastectomies:  Right mastectomy: IDC grade 2, 4 cm, lymphovascular invasion is present, margins negative, 2/7 lymph nodes are positive, ER 60% moderate to week, PR 60% moderate to weak, HER-2 negative, Ki-67 5%, T2N1A stage IIa Left mastectomy: Benign radial scar  CT chest abdomen and pelvis 08/28/2017:Few scattered pulmonary nodules throughout noted throughout the lungs bilaterally 5 mm or less, likely benign follow-up studies recommended 09/24/2017 bone scan and thoracic and lumbar MRI: No evidence of metastatic disease  Mammaprint: Low risk luminal type A  Treatment plan: 1.  Adjuvant radiation 2. followed by adjuvant antiestrogen therapy will resume letrozole 2.5 mg daily for 7 years.  Patient took neoadjuvant letrozole therapy and tolerated it well.  Return to clinic in 3 months for survivorship

## 2017-11-05 NOTE — Progress Notes (Signed)
Patient Care Team: Avon Gully, NP as PCP - General (Obstetrics and Gynecology)  DIAGNOSIS:  Encounter Diagnosis  Name Primary?  . Malignant neoplasm of upper-outer quadrant of right breast in female, estrogen receptor positive ([place])     SUMMARY OF ONCOLOGIC HISTORY:   Malignant neoplasm of upper-outer quadrant of right breast in female, estrogen receptor positive ([place])   07/30/2017 Initial Diagnosis    Palpable nodules in the right breast: 10 o'clock position: U/S 4.1 cm mass, single abnormal lymph node: Biopsy benign, biopsy of the mass revealed IDC with micropapillary features and extravasated mucin, grade 2, EF 60%, PR 60%, Ki-67 5%, HER-2 negative ratio 1.32, MRI revealed additional satellite nodules together measured 7.5 cm, T3 N0 stage II clinical stage AJCC 8    10/13/2017 Surgery    Bilateral mastectomies: Right mastectomy: IDC grade 2, 4 cm, lymphovascular invasion is present, margins negative, 2/7 lymph nodes are positive, ER 60% moderate to week, PR 60% moderate to weak, HER-2 negative, Ki-67 5%, T2N1A stage IB Left mastectomy: Benign radial scar     10/20/2017 Cancer Staging    Staging form: Breast, AJCC 8th Edition - Pathologic: Stage IB (pT2, pN1, cM0, G2, ER+, PR+, HER2-) - Signed by Nicholas Lose, MD on 10/20/2017     CHIEF COMPLIANT: Follow-up to discuss the role of axillary dissection  INTERVAL HISTORY: Sabrina Spencer is a 47 year old with above-mentioned history of bilateral mastectomies with right breast cancer who had 2+ lymph nodes out of 7 and she was presented in the tumor board and she is here today to discuss the recommendations.  Tumor board recommended that she undergo axillary lymph node dissection and she is here accompanied by her husband to discuss the results and recommendations.  She continues to have drains and is having pain and discomfort related to that.  She does have phantom pains in the breast.  She takes a muscle relaxant.  REVIEW OF  SYSTEMS:   Constitutional: Denies fevers, chills or abnormal weight loss Eyes: Denies blurriness of vision Ears, nose, mouth, throat, and face: Denies mucositis or sore throat Respiratory: Denies cough, dyspnea or wheezes Cardiovascular: Denies palpitation, chest discomfort Gastrointestinal:  Denies nausea, heartburn or change in bowel habits Skin: Denies abnormal skin rashes Lymphatics: Denies new lymphadenopathy or easy bruising Neurological:Denies numbness, tingling or new weaknesses Behavioral/Psych: Mood is stable, no new changes  Extremities: No lower extremity edema Breast: Bilateral mastectomies All other systems were reviewed with the patient and are negative.  I have reviewed the past medical history, past surgical history, social history and family history with the patient and they are unchanged from previous note.  ALLERGIES:  has No Known Allergies.  MEDICATIONS:  Current Outpatient Medications  Medication Sig Dispense Refill  . acetaminophen (TYLENOL) 500 MG tablet Take 1,000 mg by mouth every 6 (six) hours as needed for moderate pain or headache.    . ALPRAZolam (XANAX) 0.5 MG tablet Take 0.25 mg by mouth at bedtime as needed for sleep.  0  . Cholecalciferol (VITAMIN D3) 5000 units CAPS Take 10,000 Units by mouth daily.    . cyanocobalamin 2000 MCG tablet Take 6,000 mcg by mouth daily.    Marland Kitchen HYDROcodone-acetaminophen (NORCO) 5-325 MG tablet Take 1-2 tablets by mouth every 6 (six) hours as needed for moderate pain or severe pain. 20 tablet 0  . letrozole (FEMARA) 2.5 MG tablet Take 1 tablet (2.5 mg total) by mouth daily. (Patient taking differently: Take 2.5 mg by mouth at bedtime. ) 90 tablet 3  .  oxymetazoline (AFRIN) 0.05 % nasal spray Place 1 spray into both nostrils at bedtime as needed for congestion.    Marland Kitchen Phenylephrine-Acetaminophen (SINUS HEADACHE PE MAX ST PO) Take 2 tablets by mouth every 4 (four) hours as needed (sinus headaches).     No current  facility-administered medications for this visit.     PHYSICAL EXAMINATION: ECOG PERFORMANCE STATUS: 1 - Symptomatic but completely ambulatory  There were no vitals filed for this visit. There were no vitals filed for this visit.  GENERAL:alert, no distress and comfortable SKIN: skin color, texture, turgor are normal, no rashes or significant lesions EYES: normal, Conjunctiva are pink and non-injected, sclera clear OROPHARYNX:no exudate, no erythema and lips, buccal mucosa, and tongue normal  NECK: supple, thyroid normal size, non-tender, without nodularity LYMPH:  no palpable lymphadenopathy in the cervical, axillary or inguinal LUNGS: clear to auscultation and percussion with normal breathing effort HEART: regular rate & rhythm and no murmurs and no lower extremity edema ABDOMEN:abdomen soft, non-tender and normal bowel sounds MUSCULOSKELETAL:no cyanosis of digits and no clubbing  NEURO: alert & oriented x 3 with fluent speech, no focal motor/sensory deficits EXTREMITIES: No lower extremity edema   LABORATORY DATA:  I have reviewed the data as listed CMP Latest Ref Rng & Units 10/14/2017 10/06/2017 07/31/2017  Glucose 70 - 99 mg/dL - 94 83  BUN 6 - 20 mg/dL - 14 15  Creatinine 0.44 - 1.00 mg/dL 0.66 0.72 0.79  Sodium 135 - 145 mmol/L - 138 139  Potassium 3.5 - 5.1 mmol/L - 4.3 4.4  Chloride 98 - 111 mmol/L - 106 108  CO2 22 - 32 mmol/L - 25 22  Calcium 8.9 - 10.3 mg/dL - 9.3 9.1  Total Protein 6.5 - 8.1 g/dL - 7.3 -  Total Bilirubin 0.3 - 1.2 mg/dL - 0.7 -  Alkaline Phos 38 - 126 U/L - 51 -  AST 15 - 41 U/L - 24 -  ALT 0 - 44 U/L - 18 -    Lab Results  Component Value Date   WBC 12.6 (H) 10/14/2017   HGB 12.2 10/14/2017   HCT 37.8 10/14/2017   MCV 97.2 10/14/2017   PLT 249 10/14/2017   NEUTROABS 2.4 10/06/2017    ASSESSMENT & PLAN:  Malignant neoplasm of upper-outer quadrant of right breast in female, estrogen receptor positive ([place]) 10/13/2017:Bilateral  mastectomies:  Right mastectomy: IDC grade 2, 4 cm, lymphovascular invasion is present, margins negative, 2/7 lymph nodes are positive, ER 60% moderate to week, PR 60% moderate to weak, HER-2 negative, Ki-67 5%, T2N1A stage IIa Left mastectomy: Benign radial scar  CT chest abdomen and pelvis 08/28/2017:Few scattered pulmonary nodules throughout noted throughout the lungs bilaterally 5 mm or less, likely benign follow-up studies recommended 09/24/2017 bone scan and thoracic and lumbar MRI: No evidence of metastatic disease  Mammaprint: Low risk luminal type A Based upon tumor board recommendation, we recommended that she undergo complete axillary lymph node dissection  Treatment plan: 1.  Completion axillary lymph node dissection  2. adjuvant radiation 3. followed by adjuvant antiestrogen therapy will resume letrozole 2.5 mg daily for 7 years.  Patient took neoadjuvant letrozole therapy and tolerated it well.  Return to clinic after surgery to discuss pathology report   No orders of the defined types were placed in this encounter.  The patient has a good understanding of the overall plan. she agrees with it. she will call with any problems that may develop before the next visit here.   [facility]  Loyal Gambler, MD 11/05/17

## 2017-11-05 NOTE — Telephone Encounter (Signed)
No 10/31 los orders.

## 2017-11-06 ENCOUNTER — Telehealth: Payer: Self-pay | Admitting: *Deleted

## 2017-11-06 NOTE — Telephone Encounter (Signed)
Medical records faxed to [facility]; release [phone]

## 2017-11-11 ENCOUNTER — Ambulatory Visit: Payer: BLUE CROSS/BLUE SHIELD

## 2017-11-11 ENCOUNTER — Ambulatory Visit: Payer: BLUE CROSS/BLUE SHIELD | Admitting: Radiation Oncology

## 2017-11-19 ENCOUNTER — Other Ambulatory Visit: Payer: Self-pay

## 2017-11-19 ENCOUNTER — Ambulatory Visit: Payer: BLUE CROSS/BLUE SHIELD | Attending: General Surgery | Admitting: Physical Therapy

## 2017-11-19 DIAGNOSIS — Z483 Aftercare following surgery for neoplasm: Secondary | ICD-10-CM | POA: Diagnosis not present

## 2017-11-19 DIAGNOSIS — M545 Low back pain, unspecified: Secondary | ICD-10-CM

## 2017-11-19 DIAGNOSIS — R222 Localized swelling, mass and lump, trunk: Secondary | ICD-10-CM | POA: Diagnosis present

## 2017-11-19 DIAGNOSIS — M25611 Stiffness of right shoulder, not elsewhere classified: Secondary | ICD-10-CM

## 2017-11-19 DIAGNOSIS — M6281 Muscle weakness (generalized): Secondary | ICD-10-CM | POA: Diagnosis present

## 2017-11-19 NOTE — Therapy (Signed)
[place], [place], [address] Phone: [phone]   Fax:  [phone]  Physical Therapy Evaluation  Patient Details  Name: Sabrina Spencer MRN: [id number] Date of Birth: 26-Jul-1970 Referring Provider (PT): Gaynell Face    Encounter Date: 11/19/2017  PT End of Session - 11/19/17 1249    Visit Number  1    Number of Visits  9    Date for PT Re-Evaluation  12/19/17    PT Start Time  4481    PT Stop Time  1100    PT Time Calculation (min)  45 min    Activity Tolerance  Patient tolerated treatment well    Behavior During Therapy  [facility] for tasks assessed/performed       Past Medical History:  Diagnosis Date  . Anemia    has low iron  . Arthritis   . Breast mass   . Cancer ([place])   . Degeneration of L4-L5 intervertebral disc 10/05/2017    Past Surgical History:  Procedure Laterality Date  . BREAST SURGERY     biopsy at [facility]  . MASTECTOMY W/ SENTINEL NODE BIOPSY Bilateral 10/13/2017  . MASTECTOMY W/ SENTINEL NODE BIOPSY Bilateral 10/13/2017   Procedure: BILATERAL MASTECTOMIES WITH RIGHT SENTINEL LYMPH NODE BIOPSY;  Surgeon: Fanny Skates, MD;  Location: [place];  Service: General;  Laterality: Bilateral;  . TONSILLECTOMY    . uterine ablation      There were no vitals filed for this visit.   Subjective Assessment - 11/19/17 1022    Subjective  Pt states she just got the clear to drive and recently had her drains removed. She feels that she can reach up , but does not have the strenght to bring it down.  She had some swelling in right lateral chest but the compression bra helps alot  She says she may need to have more surgery for full axillary node dissection but it is not scheduled yet . She also brings in a referral from Dr. Nelva Bush to addresss back pain     Pertinent History  bilateral mastectomy due to right breast on 10/13/2017 with 2/7 nodes on the right positive.  She does not know yet if  she will have to have chemo or radiation She said she has had bad reactions from yoga and deep tissue massage in the past from unknown reasons.  She also had degenerative disc disease in her back     Patient Stated Goals  to regain motion and reach across to make a bed.     Currently in Pain?  No/denies         [facility] PT Assessment - 11/19/17 0001      Assessment   Medical Diagnosis  right breast cancer , degenerative lumbar disc disease.    Referring Provider (PT)  Gaynell Face     Onset Date/Surgical Date  10/13/17    Hand Dominance  Right      Precautions   Precautions  None      Restrictions   Weight Bearing Restrictions  No      Balance Screen   Has the patient fallen in the past 6 months  No    Has the patient had a decrease in activity level because of a fear of falling?   No    Is the patient reluctant to leave their home because of a fear of falling?   No      Home Environment   Living  Environment  Private residence    Living Arrangements  Spouse/significant other    Available Help at Discharge  Available PRN/intermittently      Prior Function   Level of [place]  Unemployed   pt wants to start a new career   Leisure  used to go the gym, high impact aerobic classes, strength training mostly with the machines worked with a Clinical research associate,       Cognition   Overall Cognitive Status  Within Functional Limits for tasks assessed      Observation/Other Assessments   Observations  pt has healing incisions across billateral chest with no open areas She has visible fullness in right lateral chest and scapular area and ant anterior chest bilaterally with more on the right Pt comes in wearing the Prarie vida bra but could benefit fromt he Hugger style to give her more compression to to her back and lateral chest     Quick DASH   20.45      Observation/Other Assessments-Edema    Edema  --   fullness at right scapluar area  and anterior chest bilat      Sensation   Additional Comments  pt reports numbness at right lateral chest and back       Coordination   Gross Motor Movements are Fluid and Coordinated  Yes      Posture/Postural Control   Posture/Postural Control  Postural limitations    Postural Limitations  Rounded Shoulders;Forward head;Decreased thoracic kyphosis    Posture Comments  pt has limited movement in lumbar spine when she does forward bending and has pain in her back and in her legs       ROM / Strength   AROM / PROM / Strength  AROM;Strength      AROM   AROM Assessment Site  Shoulder    Right Shoulder Flexion  150 Degrees    Right Shoulder ABduction  144 Degrees   pain across chest    Right Shoulder Internal Rotation  30 Degrees    Right Shoulder External Rotation  90 Degrees    Left Shoulder Flexion  155 Degrees    Left Shoulder ABduction  140 Degrees    Left Shoulder Internal Rotation  30 Degrees    Left Shoulder External Rotation  90 Degrees    Lumbar Extension  pt able to reach down to ankles but has no visible flexion at lumbar area with increase flexion at upper back and hips       Strength   Overall Strength  Deficits    Overall Strength Comments  pt has pulling and pain in right chest with elevation of left arm  She has decreased stabalization of right scapula with elevation       Palpation   Palpation comment  fullness in right midback and muscle tightness in lumbar paraspinals         LYMPHEDEMA/ONCOLOGY QUESTIONNAIRE - 11/19/17 1102      Right Upper Extremity Lymphedema   15 cm Proximal to Olecranon Process  31.5 cm    Olecranon Process  24 cm    15 cm Proximal to Ulnar Styloid Process  24.5 cm    Just Proximal to Ulnar Styloid Process  15 cm    Across Hand at [employer]  18.5 cm    At [place] of 2nd Digit  6.3 cm      Left Upper Extremity Lymphedema   15 cm Proximal to Olecranon Process  31 cm    Olecranon Process  24 cm    15 cm Proximal to Ulnar Styloid Process  23.5 cm    Just  Proximal to Ulnar Styloid Process  15.5 cm    Across Hand at [employer]  18 cm    At [place] of 2nd Digit  6.2 cm          Quick Dash - 11/19/17 0001    Open a tight or new jar  Mild difficulty    Do heavy household chores (wash walls, wash floors)  Mild difficulty    Carry a shopping bag or briefcase  No difficulty    Wash your back  Moderate difficulty    Use a knife to cut food  No difficulty    Recreational activities in which you take some force or impact through your arm, shoulder, or hand (golf, hammering, tennis)  Moderate difficulty    During the past week, to what extent has your arm, shoulder or hand problem interfered with your normal social activities with family, friends, neighbors, or groups?  Not at all    During the past week, to what extent has your arm, shoulder or hand problem limited your work or other regular daily activities  Slightly    Arm, shoulder, or hand pain.  Mild    Tingling (pins and needles) in your arm, shoulder, or hand  None    Difficulty Sleeping  Mild difficulty    DASH Score  20.45 %        Objective measurements completed on examination: See above findings.                   PT Long Term Goals - 11/19/17 1821      PT LONG TERM GOAL #1   Title  Pt will have painfree right shoulder abduction of > 150 degrees so that she can perform her ADLs easier    Time  4    Period  Weeks    Status  New      PT LONG TERM GOAL #2   Title  Pt will verbalize lymphedema risk reduction     Time  4    Period  Weeks    Status  New      PT LONG TERM GOAL #3   Title  Pt will be independent in a home program for UE and core ROM and strength     Time  4    Period  Weeks    Status  New      PT LONG TERM GOAL #4   Title  Pt will report she has had a decrease in occurance of low back pain by 50%    Time  4    Period  Weeks    Status  New             Plan - 11/19/17 1249    Clinical Impression Statement  Pt presents 5 weeks  after bilateral mastectomy. Athough she has good, but not full, shoulder elevation she feels weakness in her right shoulder when she reaches.  She has stiffness in her spine and wants to have treatment for her back pain also. She is fearful of getting lymphedema and needs to have risk reduction education     History and Personal Factors relevant to plan of care:  recent bilateral mastectomy.  Will need to have full axillary node dissection on the right     Clinical Presentation  Evolving    Clinical Presentation due to:  will have more surgery     Clinical Decision Making  Moderate    Rehab Potential  Good    Clinical Impairments Affecting Rehab Potential  pt has had bad reactions to yoga and deep tissue massage in the past due to unknown reason     PT Frequency  2x / week    PT Duration  4 weeks    PT Treatment/Interventions  ADLs/Self Care Home Management;Therapeutic activities;Therapeutic exercise;Orthotic Fit/Training;Patient/family education;Manual techniques;Manual lymph drainage;Compression bandaging;Scar mobilization;Passive range of motion;Moist Heat    PT Next Visit Plan  MLD to right anterior, lateral chest and back, assess compression bra, A/AAROM with strengthening to interscapular area, transverse abdominals, lumbar and hamstring stretching , progress strengthening program and later teach Strength ABC program  moist heat and soft tissue work to low back as needed     Consulted and Agree with Plan of Care  Patient       Patient will benefit from skilled therapeutic intervention in order to improve the following deficits and impairments:  Decreased knowledge of use of DME, Decreased skin integrity, Increased fascial restricitons, Pain, Decreased scar mobility, Postural dysfunction, Decreased strength, Decreased range of motion, Decreased activity tolerance, Decreased knowledge of precautions  Visit Diagnosis: Aftercare following surgery for neoplasm - Plan: PT plan of care  cert/re-cert  Localized swelling, mass and lump, trunk - Plan: PT plan of care cert/re-cert  Low back pain without sciatica, unspecified back pain laterality, unspecified chronicity - Plan: PT plan of care cert/re-cert  Muscle weakness (generalized) - Plan: PT plan of care cert/re-cert  Stiffness of right shoulder joint - Plan: PT plan of care cert/re-cert     Problem List Patient Active Problem List   Diagnosis Date Noted  . Breast cancer of upper-outer quadrant of right female breast ([place]) 10/13/2017  . Malignant neoplasm of upper-outer quadrant of right breast in female, estrogen receptor positive ([place]) 08/12/2017   Donato Heinz. Owens Shark PT  [place] Levo 11/19/2017, 6:26 PM  [place], [place], [address] Phone: [phone]   Fax:  [phone]  Name: Sabrina Spencer MRN: [id number] Date of Birth: March 11, 1970

## 2017-11-20 ENCOUNTER — Ambulatory Visit: Payer: BLUE CROSS/BLUE SHIELD | Admitting: Physical Therapy

## 2017-11-20 ENCOUNTER — Encounter: Payer: Self-pay | Admitting: Physical Therapy

## 2017-11-20 ENCOUNTER — Other Ambulatory Visit: Payer: Self-pay

## 2017-11-20 DIAGNOSIS — M545 Low back pain, unspecified: Secondary | ICD-10-CM

## 2017-11-20 DIAGNOSIS — Z483 Aftercare following surgery for neoplasm: Secondary | ICD-10-CM | POA: Diagnosis not present

## 2017-11-20 DIAGNOSIS — M6281 Muscle weakness (generalized): Secondary | ICD-10-CM

## 2017-11-20 DIAGNOSIS — R222 Localized swelling, mass and lump, trunk: Secondary | ICD-10-CM

## 2017-11-20 NOTE — Therapy (Addendum)
[place], [place], [address] Phone: [phone]   Fax:  [phone]  Physical Therapy Treatment  Patient Details  Name: Sabrina Spencer MRN: [id number] Date of Birth: 10/14/1970 Referring Provider (PT): Gaynell Face    Encounter Date: 11/20/2017  PT End of Session - 11/20/17 1156    Visit Number  2    Number of Visits  9    Date for PT Re-Evaluation  12/19/17    PT Start Time  1107   pt arrived late   PT Stop Time  1146    PT Time Calculation (min)  39 min    Activity Tolerance  Patient tolerated treatment well    Behavior During Therapy  [facility] for tasks assessed/performed       Past Medical History:  Diagnosis Date  . Anemia    has low iron  . Arthritis   . Breast mass   . Cancer ([place])   . Degeneration of L4-L5 intervertebral disc 10/05/2017    Past Surgical History:  Procedure Laterality Date  . BREAST SURGERY     biopsy at [facility]  . MASTECTOMY W/ SENTINEL NODE BIOPSY Bilateral 10/13/2017  . MASTECTOMY W/ SENTINEL NODE BIOPSY Bilateral 10/13/2017   Procedure: BILATERAL MASTECTOMIES WITH RIGHT SENTINEL LYMPH NODE BIOPSY;  Surgeon: Fanny Skates, MD;  Location: [place];  Service: General;  Laterality: Bilateral;  . TONSILLECTOMY    . uterine ablation      There were no vitals filed for this visit.  Subjective Assessment - 11/20/17 1110    Subjective  I feel good today. The compression bra helped so much to feel better. I went to get the compression bra with more coverage for the back and I wore it all day yesterday. The swelling went to my back last night.     Pertinent History  bilateral mastectomy due to right breast on 10/13/2017 with 2/7 nodes on the right positive.  She does not know yet if she will have to have chemo or radiation She said she has had bad reactions from yoga and deep tissue massage in the past from unknown reasons.  She also had degenerative disc disease in her back      Patient Stated Goals  to regain motion and reach across to make a bed.     Currently in Pain?  No/denies                       [facility] Adult PT Treatment/Exercise - 11/20/17 0001      Exercises   Exercises  Lumbar      Lumbar Exercises: Stretches   Pelvic Tilt  5 reps;5 seconds    Pelvic Tilt Limitations  pt returned demonstrated      Manual Therapy   Manual Therapy  Manual Lymphatic Drainage (MLD);Edema management    Edema Management  assessed pt's compression bra for fit today and it was rolling at the bottom causing too much pressure and decreasing lymph flow causing increased swelling in back and lateral trunk    Manual Lymphatic Drainage (MLD)  short neck, superficial and deep abdominals, right inguinal nodes and establishment of axillo inguinal pathway, left axillary  nodes and establishment of interaxillary pathway, then drainage of right lateral trunk and right anterior chest just inferior to mastetomy scar moving fluid towards pathways then to left sidelying working of right posterior upper quadrant moving fluid towards pathways, finished in supine retracing pathways  PT Education - 11/20/17 1216    Education Details  lymphedema risk reduction practices, ABC class    Person(s) Educated  Patient    Methods  Explanation;Handout    Comprehension  Verbalized understanding          PT Long Term Goals - 11/19/17 1821      PT LONG TERM GOAL #1   Title  Pt will have painfree right shoulder abduction of > 150 degrees so that she can perform her ADLs easier    Time  4    Period  Weeks    Status  New      PT LONG TERM GOAL #2   Title  Pt will verbalize lymphedema risk reduction     Time  4    Period  Weeks    Status  New      PT LONG TERM GOAL #3   Title  Pt will be independent in a home program for UE and core ROM and strength     Time  4    Period  Weeks    Status  New      PT LONG TERM GOAL #4   Title  Pt will report she has had  a decrease in occurance of low back pain by 50%    Time  4    Period  Weeks    Status  New            Plan - 11/20/17 1156    Clinical Impression Statement  Pt reports she had increased swelling in her back after wearing her new compression bra with increased back coverage. The compression bra was rolling at the bottom and cutting into her side which was decreasing lymph flow. Pt to go back to store and try a camisole instead of a bra to help prevent this. Educated pt on lymphedema risk reduction practices today and also ABC class which she plans to attend on Monday. Began MLD to R upper quadrant and also instructed pt in pelvic tilts for her back.     Rehab Potential  Good    Clinical Impairments Affecting Rehab Potential  pt has had bad reactions to yoga and deep tissue massage in the past due to unknown reason     PT Frequency  2x / week    PT Duration  4 weeks    PT Treatment/Interventions  ADLs/Self Care Home Management;Therapeutic activities;Therapeutic exercise;Orthotic Fit/Training;Patient/family education;Manual techniques;Manual lymph drainage;Compression bandaging;Scar mobilization;Passive range of motion;Moist Heat    PT Next Visit Plan  instruct and issue handout MLD to right anterior, lateral chest and back, assess indep with pelvic tilts, give seated cat/cow, assess compression bra, A/AAROM with strengthening to interscapular area, transverse abdominals, lumbar and hamstring stretching , progress strengthening program and later teach Strength ABC program  moist heat and soft tissue work to low back as needed     PT Home Exercise Plan  pelvic tilts    Consulted and Agree with Plan of Care  Patient       Patient will benefit from skilled therapeutic intervention in order to improve the following deficits and impairments:  Decreased knowledge of use of DME, Decreased skin integrity, Increased fascial restricitons, Pain, Decreased scar mobility, Postural dysfunction, Decreased  strength, Decreased range of motion, Decreased activity tolerance, Decreased knowledge of precautions  Visit Diagnosis: Localized swelling, mass and lump, trunk  Low back pain without sciatica, unspecified back pain laterality, unspecified chronicity  Muscle weakness (generalized)  Problem List Patient Active Problem List   Diagnosis Date Noted  . Breast cancer of upper-outer quadrant of right female breast ([place]) 10/13/2017  . Malignant neoplasm of upper-outer quadrant of right breast in female, estrogen receptor positive ([place]) 08/12/2017    Allyson Sabal [facility] 11/20/2017, 12:16 PM  [place], [place], [address] Phone: [phone]   Fax:  [phone]  Name: Sabrina Spencer MRN: [id number] Date of Birth: [birth year]  Manus Gunning, PT 11/20/17 12:16 PM

## 2017-11-23 ENCOUNTER — Encounter: Payer: Self-pay | Admitting: Rehabilitation

## 2017-11-23 ENCOUNTER — Ambulatory Visit: Payer: BLUE CROSS/BLUE SHIELD | Admitting: Rehabilitation

## 2017-11-23 DIAGNOSIS — M6281 Muscle weakness (generalized): Secondary | ICD-10-CM

## 2017-11-23 DIAGNOSIS — M25611 Stiffness of right shoulder, not elsewhere classified: Secondary | ICD-10-CM

## 2017-11-23 DIAGNOSIS — M545 Low back pain, unspecified: Secondary | ICD-10-CM

## 2017-11-23 DIAGNOSIS — Z483 Aftercare following surgery for neoplasm: Secondary | ICD-10-CM

## 2017-11-23 DIAGNOSIS — R222 Localized swelling, mass and lump, trunk: Secondary | ICD-10-CM

## 2017-11-23 NOTE — Patient Instructions (Addendum)
Self manual lymph drainage: Perform this sequence once a day.  Only give enough pressure no your skin to make the skin move.  Diaphragmatic - Supine   Inhale through nose making navel move out toward hands. Exhale through puckered lips, hands follow navel in. Repeat _5__ times. Rest _10__ seconds between repeats.   Copyright  VHI. All rights reserved.  Hug yourself.  Do circles at your neck just above your collarbones.  Repeat this 10 times.  Axilla - One at a Time   Using full weight of flat hand and fingers at center of uninvolved armpit, make _10__ in-place circles.   Copyright  VHI. All rights reserved.  LEG: Inguinal Nodes Stimulation   With small finger side of hand against hip crease on involved side, gently perform circles at the crease. Repeat __10_ times.   Copyright  VHI. All rights reserved.  1) Axilla to Inguinal Nodes - Sweep   On involved side, sweep _4__ times from armpit along side of trunk to hip crease.  Now gently stretch skin from the involved side to the uninvolved side across the chest at the shoulder line.  Repeat that 4 times.  Spend time in sidelying at the lateral trunk/swollen areas  Finish by doing the pathways as described above going from your involved armpit to the same side groin and going across your upper chest from the involved shoulder to the uninvolved shoulder.  Repeat the steps above where you do circles in your left groin and right armpit. Copyright  VHI. All rights reserved.   If you do the Lt side as well start with circles in the Lt groin

## 2017-11-23 NOTE — Therapy (Signed)
[place], [place], [address] Phone: [phone]   Fax:  [phone]  Physical Therapy Treatment  Patient Details  Name: Sabrina Spencer MRN: [id number] Date of Birth: 05/29/1970 Referring Provider (PT): Gaynell Face    Encounter Date: 11/23/2017  PT End of Session - 11/23/17 0851    Visit Number  3    Number of Visits  9    Date for PT Re-Evaluation  12/19/17    PT Start Time  0845    PT Stop Time  0930    PT Time Calculation (min)  45 min    Activity Tolerance  Patient tolerated treatment well    Behavior During Therapy  [facility] for tasks assessed/performed       Past Medical History:  Diagnosis Date  . Anemia    has low iron  . Arthritis   . Breast mass   . Cancer ([place])   . Degeneration of L4-L5 intervertebral disc 10/05/2017    Past Surgical History:  Procedure Laterality Date  . BREAST SURGERY     biopsy at [facility]  . MASTECTOMY W/ SENTINEL NODE BIOPSY Bilateral 10/13/2017  . MASTECTOMY W/ SENTINEL NODE BIOPSY Bilateral 10/13/2017   Procedure: BILATERAL MASTECTOMIES WITH RIGHT SENTINEL LYMPH NODE BIOPSY;  Surgeon: Fanny Skates, MD;  Location: [place];  Service: General;  Laterality: Bilateral;  . TONSILLECTOMY    . uterine ablation      There were no vitals filed for this visit.  Subjective Assessment - 11/23/17 0848    Subjective  My husband came in today to learn MLD .  Pt arrives with new compression tank top instead of the bra .  My back was killing me after last time     Pertinent History  bilateral mastectomy due to right breast on 10/13/2017 with 2/7 nodes on the right positive.  Will be going back to get 17 more nodes removed on the Rt.  She does not know yet if she will have to have chemo or radiation She said she has had bad reactions from yoga and deep tissue massage in the past from unknown reasons.  She also had degenerative disc disease in her back     Patient Stated  Goals  to regain motion and reach across to make a bed.     Currently in Pain?  No/denies                       [facility] Adult PT Treatment/Exercise - 11/23/17 0001      Manual Therapy   Manual therapy comments  pts husband present to learn MLD.  He participated in all steps with cueing and modification as needed    Edema Management  pt arriving in new wear ease? tank top which fits very well.  Education on lymphedema risk and precuations briefly    Manual Lymphatic Drainage (MLD)  short neck, superficial and deep abdominals, right inguinal nodes and establishment of axillo inguinal pathway, left axillary  nodes and establishment of interaxillary pathway, then drainage of right lateral trunk and right anterior chest just inferior to mastetomy scar moving fluid towards pathways then to left sidelying working of right posterior upper quadrant moving fluid towards pathways, finished in supine retracing pathways             PT Education - 11/23/17 1112    Education Details  MLD to the pt and husband with modified breast MLD handout  Person(s) Educated  Patient    Methods  Explanation;Handout    Comprehension  Verbalized understanding          PT Long Term Goals - 11/19/17 1821      PT LONG TERM GOAL #1   Title  Pt will have painfree right shoulder abduction of > 150 degrees so that she can perform her ADLs easier    Time  4    Period  Weeks    Status  New      PT LONG TERM GOAL #2   Title  Pt will verbalize lymphedema risk reduction     Time  4    Period  Weeks    Status  New      PT LONG TERM GOAL #3   Title  Pt will be independent in a home program for UE and core ROM and strength     Time  4    Period  Weeks    Status  New      PT LONG TERM GOAL #4   Title  Pt will report she has had a decrease in occurance of low back pain by 50%    Time  4    Period  Weeks    Status  New            Plan - 11/23/17 1113    Clinical Impression Statement   Pt liking her new compression tank.  She arrived with her husband who were both educated on self MLD with sidelying lateral trunk focus using the breast MLD handout modified for lateral trunk.  Pt did request no more pelvic tilts so she will not do these at home and we can focus on more neutral stability to prepare for DIEP flap     Clinical Impairments Affecting Rehab Potential  pt has had bad reactions to yoga and deep tissue massage in the past due to unknown reason     PT Frequency  2x / week    PT Duration  4 weeks    PT Treatment/Interventions  ADLs/Self Care Home Management;Therapeutic activities;Therapeutic exercise;Orthotic Fit/Training;Patient/family education;Manual techniques;Manual lymph drainage;Compression bandaging;Scar mobilization;Passive range of motion;Moist Heat    PT Next Visit Plan  how was self MLD any questions? continue edema managment for bilateral lateral trunk focus on Rt, lumbar and abdominal muscle activation to prepare for DIEP flap     PT Home Exercise Plan  self MLD    Consulted and Agree with Plan of Care  Patient       Patient will benefit from skilled therapeutic intervention in order to improve the following deficits and impairments:  Decreased knowledge of use of DME, Decreased skin integrity, Increased fascial restricitons, Pain, Decreased scar mobility, Postural dysfunction, Decreased strength, Decreased range of motion, Decreased activity tolerance, Decreased knowledge of precautions  Visit Diagnosis: Localized swelling, mass and lump, trunk  Low back pain without sciatica, unspecified back pain laterality, unspecified chronicity  Muscle weakness (generalized)  Aftercare following surgery for neoplasm  Stiffness of right shoulder joint     Problem List Patient Active Problem List   Diagnosis Date Noted  . Breast cancer of upper-outer quadrant of right female breast ([place]) 10/13/2017  . Malignant neoplasm of upper-outer quadrant of right breast  in female, estrogen receptor positive ([place]) 08/12/2017    Shan Levans, PT 11/23/2017, 11:18 AM  [place], [place], [address] Phone: [phone]   Fax:  [phone]  Name: Sabrina  Spencer MRN: [id number] Date of Birth: 25-Jul-1970

## 2017-11-25 ENCOUNTER — Ambulatory Visit: Payer: BLUE CROSS/BLUE SHIELD | Admitting: Rehabilitation

## 2017-11-25 ENCOUNTER — Encounter: Payer: Self-pay | Admitting: Rehabilitation

## 2017-11-25 DIAGNOSIS — M6281 Muscle weakness (generalized): Secondary | ICD-10-CM

## 2017-11-25 DIAGNOSIS — M25611 Stiffness of right shoulder, not elsewhere classified: Secondary | ICD-10-CM

## 2017-11-25 DIAGNOSIS — Z483 Aftercare following surgery for neoplasm: Secondary | ICD-10-CM | POA: Diagnosis not present

## 2017-11-25 DIAGNOSIS — M545 Low back pain, unspecified: Secondary | ICD-10-CM

## 2017-11-25 DIAGNOSIS — R222 Localized swelling, mass and lump, trunk: Secondary | ICD-10-CM

## 2017-11-25 NOTE — Patient Instructions (Signed)
Access Code: [email or web address]  URL: [email or web address]  Date: 11/25/2017  Prepared by: Shan Levans   Exercises  Supine Lower Trunk Rotation - 10 reps - 1 sets - 2 seconds hold - 1x daily - 7x weekly  Single Knee to Chest Stretch - 2 reps - 1 sets - 20 seconds hold - 1x daily - 7x weekly  Supine Double Knee to Chest - 2 reps - 1 sets - 20 seconds hold - 1x daily - 7x weekly  Supine Hamstring Stretch with Strap - 3 reps - 1 sets - 20 seconds hold - 1x daily - 7x weekly  Supine Piriformis Stretch - 3 reps - 1 sets - 20 seconds hold - 1x daily - 7x weekly  Hooklying Transversus Abdominis Palpation - 10 reps - 1 sets - 6 seconds hold - 1x daily - 7x weekly

## 2017-11-25 NOTE — Therapy (Signed)
[place], [place], [address] Phone: [phone]   Fax:  [phone]  Physical Therapy Treatment  Patient Details  Name: Taneesha Edgin MRN: [id number] Date of Birth: September 22, 1970 Referring Provider (PT): Gaynell Face    Encounter Date: 11/25/2017  PT End of Session - 11/25/17 2426    Visit Number  4    Number of Visits  9    Date for PT Re-Evaluation  12/19/17    PT Start Time  0800    PT Stop Time  0845    PT Time Calculation (min)  45 min    Activity Tolerance  Patient tolerated treatment well    Behavior During Therapy  [facility] for tasks assessed/performed       Past Medical History:  Diagnosis Date  . Anemia    has low iron  . Arthritis   . Breast mass   . Cancer ([place])   . Degeneration of L4-L5 intervertebral disc 10/05/2017    Past Surgical History:  Procedure Laterality Date  . BREAST SURGERY     biopsy at [facility]  . MASTECTOMY W/ SENTINEL NODE BIOPSY Bilateral 10/13/2017  . MASTECTOMY W/ SENTINEL NODE BIOPSY Bilateral 10/13/2017   Procedure: BILATERAL MASTECTOMIES WITH RIGHT SENTINEL LYMPH NODE BIOPSY;  Surgeon: Fanny Skates, MD;  Location: [place];  Service: General;  Laterality: Bilateral;  . TONSILLECTOMY    . uterine ablation      There were no vitals filed for this visit.  Subjective Assessment - 11/25/17 0802    Subjective  Lets work on the back today.  The biggest issue is the swelling and tightness in the morning.      Pertinent History  bilateral mastectomy due to right breast on 10/13/2017 with 2/7 nodes on the right positive.  Will be going back to get 17 more nodes removed on the Rt.  She does not know yet if she will have to have chemo or radiation She said she has had bad reactions from yoga and deep tissue massage in the past from unknown reasons.  She also had degenerative disc disease in her back     Patient Stated Goals  to regain motion and reach across to make a  bed.     Currently in Pain?  No/denies                       [facility] Adult PT Treatment/Exercise - 11/25/17 0001      Exercises   Exercises  Lumbar      Lumbar Exercises: Stretches   Active Hamstring Stretch  2 reps;20 seconds;Right;Left    Single Knee to Chest Stretch  2 reps;20 seconds    Double Knee to Chest Stretch  2 reps;20 seconds    Lower Trunk Rotation Limitations  10x5"     Hip Flexor Stretch  --    Lumbar Stabilization Level 1  5 reps;10 seconds    Lumbar Stabilization Level 1 Limitations  sig vcs and tcs needed with mostly diaphragm activity      Manual Therapy   Edema Management  showed pt swell spot showed her how to order one on [employer] with procode and fashioned one with 1/2" gray foam              PT Education - 11/25/17 0847    Education Details  lumbar stretches for morning stiffness and trA activation    Person(s) Educated  Patient    Methods  Explanation;Demonstration;Tactile cues;Verbal cues;Handout    Comprehension  Verbalized understanding;Verbal cues required;Returned demonstration;Tactile cues required;Need further instruction          PT Long Term Goals - 11/19/17 1821      PT LONG TERM GOAL #1   Title  Pt will have painfree right shoulder abduction of > 150 degrees so that she can perform her ADLs easier    Time  4    Period  Weeks    Status  New      PT LONG TERM GOAL #2   Title  Pt will verbalize lymphedema risk reduction     Time  4    Period  Weeks    Status  New      PT LONG TERM GOAL #3   Title  Pt will be independent in a home program for UE and core ROM and strength     Time  4    Period  Weeks    Status  New      PT LONG TERM GOAL #4   Title  Pt will report she has had a decrease in occurance of low back pain by 50%    Time  4    Period  Weeks    Status  New            Plan - 11/25/17 0848    Clinical Impression Statement  Pt given ordering instructions for swell spot kimbe on [employer] website  using PT code.  She is now unsure about doing DIEP after meeting with surgeon on Monday.  Biggest complaint today is morning severity of swelling and back stiffness.  Pts husband did MLD well.      Clinical Impairments Affecting Rehab Potential  pt has had bad reactions to yoga and deep tissue massage in the past due to unknown reason     PT Frequency  2x / week    PT Duration  4 weeks    PT Treatment/Interventions  ADLs/Self Care Home Management;Therapeutic activities;Therapeutic exercise;Orthotic Fit/Training;Patient/family education;Manual techniques;Manual lymph drainage;Compression bandaging;Scar mobilization;Passive range of motion;Moist Heat    PT Next Visit Plan  how was self MLD any questions? continue edema managment for bilateral lateral trunk focus on Rt, lumbar and abdominal muscle activation to prepare for DIEP flap     Consulted and Agree with Plan of Care  Patient       Patient will benefit from skilled therapeutic intervention in order to improve the following deficits and impairments:  Decreased knowledge of use of DME, Decreased skin integrity, Increased fascial restricitons, Pain, Decreased scar mobility, Postural dysfunction, Decreased strength, Decreased range of motion, Decreased activity tolerance, Decreased knowledge of precautions  Visit Diagnosis: Localized swelling, mass and lump, trunk  Low back pain without sciatica, unspecified back pain laterality, unspecified chronicity  Muscle weakness (generalized)  Aftercare following surgery for neoplasm  Stiffness of right shoulder joint     Problem List Patient Active Problem List   Diagnosis Date Noted  . Breast cancer of upper-outer quadrant of right female breast ([place]) 10/13/2017  . Malignant neoplasm of upper-outer quadrant of right breast in female, estrogen receptor positive ([place]) 08/12/2017    Shan Levans, PT 11/25/2017, 8:50 AM  [place], [place], [address] Phone: [phone]   Fax:  [phone]  Name: Amilah Greenspan MRN: [id number] Date of Birth: November 20, 1970

## 2017-11-26 ENCOUNTER — Other Ambulatory Visit: Payer: Self-pay | Admitting: General Surgery

## 2017-11-27 ENCOUNTER — Other Ambulatory Visit: Payer: Self-pay | Admitting: *Deleted

## 2017-11-27 DIAGNOSIS — R918 Other nonspecific abnormal finding of lung field: Secondary | ICD-10-CM

## 2017-11-30 ENCOUNTER — Inpatient Hospital Stay: Payer: BLUE CROSS/BLUE SHIELD | Attending: Hematology and Oncology | Admitting: Hematology and Oncology

## 2017-11-30 ENCOUNTER — Ambulatory Visit: Payer: BLUE CROSS/BLUE SHIELD

## 2017-11-30 DIAGNOSIS — M6281 Muscle weakness (generalized): Secondary | ICD-10-CM

## 2017-11-30 DIAGNOSIS — M545 Low back pain, unspecified: Secondary | ICD-10-CM

## 2017-11-30 DIAGNOSIS — Z483 Aftercare following surgery for neoplasm: Secondary | ICD-10-CM | POA: Diagnosis not present

## 2017-11-30 DIAGNOSIS — M25611 Stiffness of right shoulder, not elsewhere classified: Secondary | ICD-10-CM

## 2017-11-30 DIAGNOSIS — R222 Localized swelling, mass and lump, trunk: Secondary | ICD-10-CM

## 2017-11-30 NOTE — Therapy (Signed)
[place], [place], [address] Phone: [phone]   Fax:  [phone]  Physical Therapy Treatment  Patient Details  Name: Sabrina Spencer MRN: [id number] Date of Birth: Aug 16, 1970 Referring Provider (PT): Gaynell Face    Encounter Date: 11/30/2017  PT End of Session - 11/30/17 1022    Visit Number  5    Number of Visits  9    Date for PT Re-Evaluation  12/19/17    PT Start Time  0932    PT Stop Time  1020    PT Time Calculation (min)  48 min    Activity Tolerance  Patient tolerated treatment well    Behavior During Therapy  [facility] for tasks assessed/performed       Past Medical History:  Diagnosis Date  . Anemia    has low iron  . Arthritis   . Breast mass   . Cancer ([place])   . Degeneration of L4-L5 intervertebral disc 10/05/2017    Past Surgical History:  Procedure Laterality Date  . BREAST SURGERY     biopsy at [facility]  . MASTECTOMY W/ SENTINEL NODE BIOPSY Bilateral 10/13/2017  . MASTECTOMY W/ SENTINEL NODE BIOPSY Bilateral 10/13/2017   Procedure: BILATERAL MASTECTOMIES WITH RIGHT SENTINEL LYMPH NODE BIOPSY;  Surgeon: Fanny Skates, MD;  Location: [facility];  Service: General;  Laterality: Bilateral;  . TONSILLECTOMY    . uterine ablation      There were no vitals filed for this visit.  Subjective Assessment - 11/30/17 0934    Subjective  My back has been feeling great with the new stretches and exs. I saw Dr. Dalbert Batman and he said I need to have the ALND ASAP so that is tentatively scheduled for Dec 18 if my CAT scan comes back good for the spots on my lungs. If not, we are going to focus on that. My primary concern now are my shoulders, being so tight. I want to focus on that so being positioned for the Rt ALND won't be problematic. And my Lt is still just tight since the bil mastectomy.     Pertinent History  bilateral mastectomy due to right breast on 10/13/2017 with 2/7 nodes on the right  positive.  Will be going back to get 17 more nodes removed on the Rt.  She does not know yet if she will have to have chemo or radiation She said she has had bad reactions from yoga and deep tissue massage in the past from unknown reasons.  She also had degenerative disc disease in her back     Patient Stated Goals  to regain motion and reach across to make a bed.     Currently in Pain?  No/denies                       [facility] Adult PT Treatment/Exercise - 11/30/17 0001      Manual Therapy   Manual Therapy  Myofascial release;Passive ROM    Myofascial Release  Rt UE pulling throughout ROM    Passive ROM  In Supine into Rt flexion, abduction and D2 to pts tolerance, then same to Lt                  PT Long Term Goals - 11/19/17 1821      PT LONG TERM GOAL #1   Title  Pt will have painfree right shoulder abduction of > 150 degrees so that she can perform  her ADLs easier    Time  4    Period  Weeks    Status  New      PT LONG TERM GOAL #2   Title  Pt will verbalize lymphedema risk reduction     Time  4    Period  Weeks    Status  New      PT LONG TERM GOAL #3   Title  Pt will be independent in a home program for UE and core ROM and strength     Time  4    Period  Weeks    Status  New      PT LONG TERM GOAL #4   Title  Pt will report she has had a decrease in occurance of low back pain by 50%    Time  4    Period  Weeks    Status  New            Plan - 11/30/17 1206    Clinical Impression Statement  Pt came in reporting that she saw Dr. Dalbert Batman last week and he wants ALND done ASAP. Pts only concern is she wants to have CAT scan first (this is scheduled for Wed) to see how spots on her lung look now. If no change then ALND will be Dec 18. Pts main concern for this is end ROM tightness that she feels in bil shoulders so focused on this today. Her P/ROM by end of session was full with Rt UE, and only lagging ~10-15 degrees with flexion and abduction  for Lt. Pt reports shoulders feeling much better by end of session. She also reports her and her husband being compliant with performing MLD and that this has really helpled decrease swelling/fullness. No questions about HEP. Pt still unsure about having DIEP procedure, but leaning towards not having/wanting it.     Rehab Potential  Good    Clinical Impairments Affecting Rehab Potential  pt has had bad reactions to yoga and deep tissue massage in the past due to unknown reason     PT Frequency  2x / week    PT Duration  4 weeks    PT Treatment/Interventions  ADLs/Self Care Home Management;Therapeutic activities;Therapeutic exercise;Orthotic Fit/Training;Patient/family education;Manual techniques;Manual lymph drainage;Compression bandaging;Scar mobilization;Passive range of motion;Moist Heat    PT Next Visit Plan  Results of CAT scan for lungs? Cont bil shoulder ROM in preparation for upcoming Rt ALND surgery on Dec 18; Cont edema management prn for bil trunk with focus on Rt; also cont lumabr and abdominal muscle activation in case pt decides to have DIEP flap    Consulted and Agree with Plan of Care  Patient       Patient will benefit from skilled therapeutic intervention in order to improve the following deficits and impairments:  Decreased knowledge of use of DME, Decreased skin integrity, Increased fascial restricitons, Pain, Decreased scar mobility, Postural dysfunction, Decreased strength, Decreased range of motion, Decreased activity tolerance, Decreased knowledge of precautions  Visit Diagnosis: Localized swelling, mass and lump, trunk  Low back pain without sciatica, unspecified back pain laterality, unspecified chronicity  Muscle weakness (generalized)  Aftercare following surgery for neoplasm  Stiffness of right shoulder joint     Problem List Patient Active Problem List   Diagnosis Date Noted  . Breast cancer of upper-outer quadrant of right female breast ([place]) 10/13/2017   . Malignant neoplasm of upper-outer quadrant of right breast in female, estrogen receptor positive ([place]) 08/12/2017  Otelia Limes, PTA 11/30/2017, 12:19 PM  [place], [place], [address] Phone: [phone]   Fax:  [phone]  Name: Sabrina Spencer MRN: [id number] Date of Birth: 1970/11/05

## 2017-11-30 NOTE — Assessment & Plan Note (Deleted)
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 adjuvant radiation 2. followed by adjuvant antiestrogen therapy will resume letrozole 2.5 mg daily for 7 years.  Patient took neoadjuvant letrozole therapy and tolerated it well.

## 2017-12-02 ENCOUNTER — Ambulatory Visit (HOSPITAL_COMMUNITY)
Admission: RE | Admit: 2017-12-02 | Discharge: 2017-12-02 | Disposition: A | Discharge status: Home / Self Care | Payer: BLUE CROSS/BLUE SHIELD | Source: Ambulatory Visit | Attending: Hematology and Oncology | Admitting: Hematology and Oncology

## 2017-12-02 DIAGNOSIS — R918 Other nonspecific abnormal finding of lung field: Secondary | ICD-10-CM | POA: Diagnosis not present

## 2017-12-02 MED ORDER — SODIUM CHLORIDE (PF) 0.9 % IJ SOLN
INTRAMUSCULAR | Status: AC
  Filled 2017-12-02: qty 50

## 2017-12-02 MED ORDER — IOHEXOL 300 MG/ML  SOLN
75.0000 mL | Freq: Once | INTRAMUSCULAR | Status: AC | PRN
  Administered 2017-12-02: 75 mL via INTRAVENOUS

## 2017-12-07 ENCOUNTER — Telehealth: Payer: Self-pay | Admitting: Hematology and Oncology

## 2017-12-07 ENCOUNTER — Ambulatory Visit: Payer: BLUE CROSS/BLUE SHIELD | Admitting: Rehabilitation

## 2017-12-07 NOTE — Telephone Encounter (Signed)
I informed Sabrina Spencer that the CT scans do not show any evidence of metastatic disease.  Lung nodules had been there before and are unchanged and are not malignant. She will proceed with her axillary lymph node dissection on 18 December. I will see the patient back December 26 to discuss the pathology report.

## 2017-12-08 ENCOUNTER — Telehealth: Payer: Self-pay | Admitting: Hematology and Oncology

## 2017-12-08 NOTE — Telephone Encounter (Signed)
Scheduled appt per 12/2 sch message - left message for patient with appt date and time.   

## 2017-12-09 ENCOUNTER — Ambulatory Visit: Payer: BLUE CROSS/BLUE SHIELD | Attending: General Surgery

## 2017-12-09 DIAGNOSIS — M545 Low back pain, unspecified: Secondary | ICD-10-CM

## 2017-12-09 DIAGNOSIS — R222 Localized swelling, mass and lump, trunk: Secondary | ICD-10-CM | POA: Diagnosis present

## 2017-12-09 DIAGNOSIS — Z483 Aftercare following surgery for neoplasm: Secondary | ICD-10-CM | POA: Diagnosis present

## 2017-12-09 DIAGNOSIS — M6281 Muscle weakness (generalized): Secondary | ICD-10-CM

## 2017-12-09 DIAGNOSIS — M25611 Stiffness of right shoulder, not elsewhere classified: Secondary | ICD-10-CM | POA: Insufficient documentation

## 2017-12-09 NOTE — Therapy (Signed)
[place], [place], [address] Phone: [phone]   Fax:  [phone]  Physical Therapy Treatment  Patient Details  Name: Sabrina Spencer MRN: [id number] Date of Birth: 10-31-1970 Referring Provider (PT): Gaynell Face    Encounter Date: 12/09/2017  PT End of Session - 12/09/17 0845    Visit Number  6    Number of Visits  9    Date for PT Re-Evaluation  12/19/17    PT Start Time  0807   Pt arrived late   PT Stop Time  0847    PT Time Calculation (min)  40 min    Activity Tolerance  Patient tolerated treatment well    Behavior During Therapy  [facility] for tasks assessed/performed       Past Medical History:  Diagnosis Date  . Anemia    has low iron  . Arthritis   . Breast mass   . Cancer ([place])   . Degeneration of L4-L5 intervertebral disc 10/05/2017    Past Surgical History:  Procedure Laterality Date  . BREAST SURGERY     biopsy at [facility]  . MASTECTOMY W/ SENTINEL NODE BIOPSY Bilateral 10/13/2017  . MASTECTOMY W/ SENTINEL NODE BIOPSY Bilateral 10/13/2017   Procedure: BILATERAL MASTECTOMIES WITH RIGHT SENTINEL LYMPH NODE BIOPSY;  Surgeon: Fanny Skates, MD;  Location: [facility];  Service: General;  Laterality: Bilateral;  . TONSILLECTOMY    . uterine ablation      There were no vitals filed for this visit.  Subjective Assessment - 12/09/17 0811    Subjective  The scan of my lungs was good as far as the spots are still there but unchanged so we are going forward with the ALND on the 18th. If I do the DIEP flap it's still months away. I've been going to the gym and doing the exercises I was shown here and some light core exercises with a 6 lb medicine ball and that has been helping me to feel really good. Also doing the self MLD which has been really beneficial in keeping my swelling down at my Rt>Lt trunk areas, but I can tell if I miss a day!    Pertinent History  bilateral mastectomy due  to right breast on 10/13/2017 with 2/7 nodes on the right positive.  Will be going back to get 17 more nodes removed on the Rt.  She does not know yet if she will have to have chemo or radiation She said she has had bad reactions from yoga and deep tissue massage in the past from unknown reasons.  She also had degenerative disc disease in her back     Patient Stated Goals  to regain motion and reach across to make a bed.     Currently in Pain?  No/denies                       [facility] Adult PT Treatment/Exercise - 12/09/17 0001      Shoulder Exercises: Pulleys   Flexion  2 minutes    Flexion Limitations  Demo for technique then VCs throughout to decrease scapular compensations    ABduction  2 minutes    ABduction Limitations  VCs throughout to decrease scaular compensation      Shoulder Exercises: Therapy Ball   Flexion  Both;10 reps   Forward lean into end of stretch   Flexion Limitations  Pt returned therapist demo and then VCs to decrease sacpular compensation  ABduction  Right;5 reps   Same side lean into end of stretch     Manual Therapy   Myofascial Release  Rt UE pulling throughout ROM    Passive ROM  In Supine into Rt flexion, abduction and D2 to pts tolerance, then same to Lt             PT Education - 12/09/17 0854    Education Details  Briefly instructed how to incorporate reverse curl when using large ball at gym as she is already doing, also bringing knees side to side with core activation throughout    Person(s) Educated  Patient    Methods  Explanation;Demonstration    Comprehension  Verbalized understanding          PT Long Term Goals - 11/19/17 1821      PT LONG TERM GOAL #1   Title  Pt will have painfree right shoulder abduction of > 150 degrees so that she can perform her ADLs easier    Time  4    Period  Weeks    Status  New      PT LONG TERM GOAL #2   Title  Pt will verbalize lymphedema risk reduction     Time  4    Period   Weeks    Status  New      PT LONG TERM GOAL #3   Title  Pt will be independent in a home program for UE and core ROM and strength     Time  4    Period  Weeks    Status  New      PT LONG TERM GOAL #4   Title  Pt will report she has had a decrease in occurance of low back pain by 50%    Time  4    Period  Weeks    Status  New            Plan - 12/09/17 0846    Clinical Impression Statement  Continued with focus on Rt shoulder end ROM stretching and added AA/ROM stretching in the gym. Pt liked learning this reporting she could incorporate this in the gym with he rother exercises. Pt reported feeling good after session and her end P/ROM had improved to being full by end of session as well. She should have full ROM back before ALND.     Rehab Potential  Good    Clinical Impairments Affecting Rehab Potential  pt has had bad reactions to yoga and deep tissue massage in the past due to unknown reason     PT Frequency  2x / week    PT Duration  4 weeks    PT Treatment/Interventions  ADLs/Self Care Home Management;Therapeutic activities;Therapeutic exercise;Orthotic Fit/Training;Patient/family education;Manual techniques;Manual lymph drainage;Compression bandaging;Scar mobilization;Passive range of motion;Moist Heat    PT Next Visit Plan  Cont Rt>Lt shoulder ROM in preparation for upcoming Rt ALND surgery on Dec 18; Cont edema management prn for bil trunk with focus on Rt; also cont lumabr and abdominal muscle activation in case pt decides to have DIEP flap    Consulted and Agree with Plan of Care  Patient       Patient will benefit from skilled therapeutic intervention in order to improve the following deficits and impairments:  Decreased knowledge of use of DME, Decreased skin integrity, Increased fascial restricitons, Pain, Decreased scar mobility, Postural dysfunction, Decreased strength, Decreased range of motion, Decreased activity tolerance, Decreased knowledge of precautions  Visit  Diagnosis: Localized swelling, mass and lump, trunk  Low back pain without sciatica, unspecified back pain laterality, unspecified chronicity  Muscle weakness (generalized)  Aftercare following surgery for neoplasm  Stiffness of right shoulder joint     Problem List Patient Active Problem List   Diagnosis Date Noted  . Breast cancer of upper-outer quadrant of right female breast ([place]) 10/13/2017  . Malignant neoplasm of upper-outer quadrant of right breast in female, estrogen receptor positive ([place]) 08/12/2017    Otelia Limes, PTA 12/09/2017, 8:57 AM  [place], [place], [address] Phone: [phone]   Fax:  [phone]  Name: Sabrina Spencer MRN: [id number] Date of Birth: 07-22-1970

## 2017-12-11 NOTE — Pre-Procedure Instructions (Signed)
[facility]  12/11/2017      [id number] [employer] - [place], [place] - [address] [place] [address] Phone: [phone] Fax: [phone]    Your procedure is scheduled on December 23, 2017.  Report to [facility] Admitting at 1200 PM.  Call this number if you have problems the morning of surgery:  [phone]   Remember:  Do not eat or drink after midnight.  You may drink clear liquids until 1100 AM the day of surgery.  Clear liquids allowed are:  Water, Juice (non-citric and without pulp), Carbonated beverages, Clear Tea, Black Coffee only and Gatorade    Take these medicines the morning of surgery with A SIP OF WATER -none  7 days prior to surgery STOP taking any Aspirin (unless otherwise instructed by your surgeon), Aleve, Naproxen, Ibuprofen, Motrin, Advil, Goody's, BC's, all herbal medications, fish oil, and all vitamins     Do not wear jewelry, make-up or nail polish.  Do not wear lotions, powders, or perfumes, or deodorant.  Do not shave 48 hours prior to surgery.    Do not bring valuables to the hospital.  [facility] is not responsible for any belongings or valuables.  Contacts, dentures or bridgework may not be worn into surgery.  Leave your suitcase in the car.  After surgery it may be brought to your room.  For patients admitted to the hospital, discharge time will be determined by your treatment team.  Patients discharged the day of surgery will not be allowed to drive home.    [facility]- Preparing For Surgery  Before surgery, you can play an important role. Because skin is not sterile, your skin needs to be as free of germs as possible. You can reduce the number of germs on your skin by washing with CHG (chlorahexidine gluconate) Soap before surgery.  CHG is an antiseptic cleaner which kills germs and bonds with the skin to continue killing germs even after washing.    Oral Hygiene is also important to  reduce your risk of infection.  Remember - BRUSH YOUR TEETH THE MORNING OF SURGERY WITH YOUR REGULAR TOOTHPASTE  Please do not use if you have an allergy to CHG or antibacterial soaps. If your skin becomes reddened/irritated stop using the CHG.  Do not shave (including legs and underarms) for at least 48 hours prior to first CHG shower. It is OK to shave your face.  Please follow these instructions carefully.   1. Shower the NIGHT BEFORE SURGERY and the MORNING OF SURGERY with CHG.   2. If you chose to wash your hair, wash your hair first as usual with your normal shampoo.  3. After you shampoo, rinse your hair and body thoroughly to remove the shampoo.  4. Use CHG as you would any other liquid soap. You can apply CHG directly to the skin and wash gently with a scrungie or a clean washcloth.   5. Apply the CHG Soap to your body ONLY FROM THE NECK DOWN.  Do not use on open wounds or open sores. Avoid contact with your eyes, ears, mouth and genitals (private parts). Wash Face and genitals (private parts)  with your normal soap.  6. Wash thoroughly, paying special attention to the area where your surgery will be performed.  7. Thoroughly rinse your body with warm water from the neck down.  8. DO NOT shower/wash with your normal soap after using and rinsing off the CHG Soap.  9. Pat yourself dry with a CLEAN TOWEL.  10. Wear CLEAN PAJAMAS to bed the night before surgery, wear comfortable clothes the morning of surgery  11. Place CLEAN SHEETS on your bed the night of your first shower and DO NOT SLEEP WITH PETS.  Day of Surgery:  Do not apply any deodorants/lotions.  Please wear clean clothes to the hospital/surgery center.   Remember to brush your teeth WITH YOUR REGULAR TOOTHPASTE.  Please read over the following fact sheets that you were given.

## 2017-12-14 ENCOUNTER — Other Ambulatory Visit: Payer: Self-pay

## 2017-12-14 ENCOUNTER — Encounter (HOSPITAL_COMMUNITY): Payer: Self-pay

## 2017-12-14 ENCOUNTER — Encounter: Payer: Self-pay | Admitting: Rehabilitation

## 2017-12-14 ENCOUNTER — Encounter (HOSPITAL_COMMUNITY)
Admission: RE | Admit: 2017-12-14 | Discharge: 2017-12-14 | Disposition: A | Discharge status: Home / Self Care | Payer: BLUE CROSS/BLUE SHIELD | Source: Ambulatory Visit | Attending: General Surgery | Admitting: General Surgery

## 2017-12-14 ENCOUNTER — Ambulatory Visit: Payer: BLUE CROSS/BLUE SHIELD | Admitting: Rehabilitation

## 2017-12-14 DIAGNOSIS — R222 Localized swelling, mass and lump, trunk: Secondary | ICD-10-CM | POA: Diagnosis not present

## 2017-12-14 DIAGNOSIS — Z483 Aftercare following surgery for neoplasm: Secondary | ICD-10-CM

## 2017-12-14 DIAGNOSIS — Z01812 Encounter for preprocedural laboratory examination: Secondary | ICD-10-CM | POA: Insufficient documentation

## 2017-12-14 DIAGNOSIS — M545 Low back pain, unspecified: Secondary | ICD-10-CM

## 2017-12-14 DIAGNOSIS — M6281 Muscle weakness (generalized): Secondary | ICD-10-CM

## 2017-12-14 DIAGNOSIS — M25611 Stiffness of right shoulder, not elsewhere classified: Secondary | ICD-10-CM

## 2017-12-14 HISTORY — DX: Other specified postprocedural states: Z98.890

## 2017-12-14 HISTORY — DX: Nausea with vomiting, unspecified: R11.2

## 2017-12-14 LAB — BASIC METABOLIC PANEL
Anion gap: 10 (ref 5–15)
BUN: 14 mg/dL (ref 6–20)
CO2: 24 mmol/L (ref 22–32)
Calcium: 9.4 mg/dL (ref 8.9–10.3)
Chloride: 104 mmol/L (ref 98–111)
Creatinine, Ser: 0.82 mg/dL (ref 0.44–1.00)
GFR calc Af Amer: >60 mL/min (ref >60)
GFR calc non Af Amer: >60 mL/min (ref >60)
Glucose, Bld: 104 mg/dL — ABNORMAL HIGH (ref 70–99)
Potassium: 4.6 mmol/L (ref 3.5–5.1)
Sodium: 138 mmol/L (ref 135–145)

## 2017-12-14 LAB — CBC
HCT: 45.6 % (ref 36.0–46.0)
Hemoglobin: 14.1 g/dL (ref 12.0–15.0)
MCH: 31 pg (ref 26.0–34.0)
MCHC: 30.9 g/dL (ref 30.0–36.0)
MCV: 100.2 fL — ABNORMAL HIGH (ref 80.0–100.0)
Platelets: 205 10*3/uL (ref 150–400)
RBC: 4.55 MIL/uL (ref 3.87–5.11)
RDW: 12.4 % (ref 11.5–15.5)
WBC: 4.8 10*3/uL (ref 4.0–10.5)
nRBC: 0 % (ref 0.0–0.2)

## 2017-12-14 NOTE — Patient Instructions (Signed)
Access Code: [email or web address]  URL: [email or web address]  Date: 12/14/2017  Prepared by: Shan Levans   Program Notes  Focus on activating the core muscles before each exercise. Make sure to breathe as you activate and if you feel like you lose the core activation then just stop and start again after reactivating.   Exercises  Supine 90/90 Alternating Toe Touch - 10 reps - 2 sets - 3 hold - 1x daily - 5x weekly  Supine Transversus Abdominis Bracing with Leg Extension - 10 reps - 1 sets - 6 seconds-10 seconds hold - 1x daily - 7x weekly  Supine 90/90 Abdominal Bracing - 10 reps - 10 seconds hold - 1x daily - 7x weekly  Supine Bridge - 10 reps - 10 seconds hold - 1x daily - 7x weekly  Hooklying Isometric Clamshell - 10 reps - 6 seconds hold - 1x daily - 7x weekly

## 2017-12-14 NOTE — Progress Notes (Signed)
PCP - Avon Gully, NP Cardiologist - denies  Chest x-ray - 07/31/17 EKG - 08/01/17 Stress Test - denies ECHO - denies Cardiac Cath - denies  Sleep Study - denies  Aspirin Instructions: patient advised to stop all ASA/NSAID's/vitamins and herbal medications beginning December 11th.  Anesthesia review: no  Patient denies shortness of breath, fever, cough and chest pain at PAT appointment   Patient verbalized understanding of instructions that were given to them at the PAT appointment. Patient was also instructed that they will need to review over the PAT instructions again at home before surgery.

## 2017-12-14 NOTE — Therapy (Addendum)
[place], [place], [address] Phone: [phone]   Fax:  [phone]  Physical Therapy Treatment  Patient Details  Name: Sabrina Spencer MRN: [id number] Date of Birth: Jun 09, 1970 Referring Provider (PT): Gaynell Face    Encounter Date: 12/14/2017  PT End of Session - 12/14/17 0845    Visit Number  7    Number of Visits  9    Date for PT Re-Evaluation  12/19/17    PT Start Time  0800    PT Stop Time  0845    PT Time Calculation (min)  45 min    Activity Tolerance  Patient tolerated treatment well    Behavior During Therapy  [facility] for tasks assessed/performed       Past Medical History:  Diagnosis Date  . Anemia    has low iron  . Arthritis   . Breast mass   . Cancer ([place])   . Degeneration of L4-L5 intervertebral disc 10/05/2017    Past Surgical History:  Procedure Laterality Date  . BREAST SURGERY     biopsy at [facility]  . MASTECTOMY W/ SENTINEL NODE BIOPSY Bilateral 10/13/2017  . MASTECTOMY W/ SENTINEL NODE BIOPSY Bilateral 10/13/2017   Procedure: BILATERAL MASTECTOMIES WITH RIGHT SENTINEL LYMPH NODE BIOPSY;  Surgeon: Fanny Skates, MD;  Location: [place];  Service: General;  Laterality: Bilateral;  . TONSILLECTOMY    . uterine ablation      There were no vitals filed for this visit.  Subjective Assessment - 12/14/17 0800    Subjective  Doing well overall.  My Right shoulder really hurts after doing things at the gym.  This is my last one until surgery.  Can we do some back things    Pertinent History  bilateral mastectomy due to right breast on 10/13/2017 with 2/7 nodes on the right positive.  Will be going back to get 17 more nodes removed on the Rt.  She does not know yet if she will have to have chemo or radiation She said she has had bad reactions from yoga and deep tissue massage in the past from unknown reasons.  She also had degenerative disc disease in her back     Patient  Stated Goals  to regain motion and reach across to make a bed.     Currently in Pain?  Yes    Pain Score  5     Pain Location  Shoulder    Pain Orientation  Right    Pain Descriptors / Indicators  Aching    Pain Type  Acute pain    Pain Onset  In the past 7 days    Pain Frequency  Constant    Aggravating Factors   started at the gym    Pain Relieving Factors  tylenol and rest         [facility] PT Assessment - 12/14/17 0001      AROM   Right Shoulder Flexion  167 Degrees    Right Shoulder ABduction  170 Degrees    Right Shoulder Internal Rotation  70 Degrees    Right Shoulder External Rotation  90 Degrees    Left Shoulder Flexion  170 Degrees    Left Shoulder ABduction  170 Degrees    Left Shoulder Internal Rotation  80 Degrees    Left Shoulder External Rotation  90 Degrees                   [facility] Adult  PT Treatment/Exercise - 12/14/17 0001      Exercises   Exercises  Other Exercises;Lumbar    Other Exercises   discussed how to go back to post op stretches after and limiting ROM to below 90deg until the drains are out.  Rows and shoulder blade work ok as well as the core things are ok      Lumbar Exercises: Stretches   Other Lumbar Stretch Exercise  performed new HEP for level 1-2 core stabililzation.  See instruction section for activities.  Work on not holding breath while activating the core.  Given green band for home.        Manual Therapy   Passive ROM  to Rt shoulder but not needing too much today.               PT Education - 12/14/17 0845    Education Details  post op stretches reminder, new core activation work     [employer]) Educated  Patient    Methods  Explanation    Comprehension  Verbalized understanding;Returned demonstration          PT Long Term Goals - 12/14/17 0847      PT LONG TERM GOAL #1   Title  Pt will have painfree right shoulder abduction of > 150 degrees so that she can perform her ADLs easier    Status  Achieved       PT LONG TERM GOAL #2   Title  Pt will verbalize lymphedema risk reduction     Status  Achieved      PT LONG TERM GOAL #3   Title  Pt will be independent in a home program for UE and core ROM and strength     Status  Achieved      PT LONG TERM GOAL #4   Title  Pt will report she has had a decrease in occurance of low back pain by 50%    Status  On-going            Plan - 12/14/17 0846    Clinical Impression Statement  Shoulder ROM bilateral full and ready for ALND next week.  Pt does have some new Rt shoulder pain which she attributes to doing 20pounds at the gym recently.  It is starting to feel better.  Is also still having back pain and wanted some core exercises to work on while recvering from surgery and to decrease back pain.  Gave pt level 1 activation activities with good performance      PT Next Visit Plan  will re assess after surgery if pt returns        Patient will benefit from skilled therapeutic intervention in order to improve the following deficits and impairments:     Visit Diagnosis: Localized swelling, mass and lump, trunk  Low back pain without sciatica, unspecified back pain laterality, unspecified chronicity  Aftercare following surgery for neoplasm  Muscle weakness (generalized)  Stiffness of right shoulder joint     Problem List Patient Active Problem List   Diagnosis Date Noted  . Breast cancer of upper-outer quadrant of right female breast ([place]) 10/13/2017  . Malignant neoplasm of upper-outer quadrant of right breast in female, estrogen receptor positive ([place]) 08/12/2017    Shan Levans, PT 12/14/2017, 8:48 AM  [place], [place], [address] Phone: [phone]   Fax:  [phone]  Name: Sabrina Spencer MRN: [id number] Date of Birth: 10-28-1970  PHYSICAL THERAPY  DISCHARGE SUMMARY  Visits from Start of Care: 7  Current functional level related to goals /  functional outcomes: Pt ready for DC and next surgery    Remaining deficits: Possible post op needs   Education / Equipment: Final HEP Plan: Patient agrees to discharge.  Patient goals were met. Patient is being discharged due to meeting the stated rehab goals.  ?????    Shan Levans, PT

## 2017-12-16 ENCOUNTER — Other Ambulatory Visit (HOSPITAL_COMMUNITY): Payer: BLUE CROSS/BLUE SHIELD

## 2017-12-18 ENCOUNTER — Encounter (HOSPITAL_COMMUNITY): Payer: Self-pay | Admitting: *Deleted

## 2017-12-20 NOTE — H&P (Signed)
Gus Rankin Location: [facility] Patient #: [id number] DOB: 1970-10-30 Married / Language: English / Race: Undefined Female        History of Present Illness   This is a 47 year old female who returns for scheduling of right axillary lymph node dissection.  Avon Gully is her PCP. Dr. Lindi Adie and Dr. Sondra Come are involved in her care.      On October 15, 2017 she underwent right total mastectomy with sentinel biopsy and left prophylactic mastectomy. The left side reveals only benign radial scar. 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 This was a sentinel lymph node mapping. Receptors positive but weak. HER-2 negative. Mammoprint reportedly low risk. Dr. Lindi Adie and I have discussed her care and because of her young age we have advised complete right axillary lymph node dissection to complete her surgical staging. We have discussed this with her and she is in agreement.      She had some problems with infection but that has now cleared up. She's done well with physical therapy and has range of motion back in her right shoulder. Her wounds look good today.      Comorbidities are minimal. Family history negative for breast or ovarian cancer. Social history reveals that she is married with 2 children. Denies tobacco. Drinks 2 beers a day. Works as a Development worker, community.      We had a long discussion. We talked about the advantages and risks of complete axillary lymph node dissection. Basically I advised her to have this as the standard of care in her situation. We would like to avoid axillary recurrence which is very difficult to manage. She knows that she is at increased risk for arm swelling and arm numbness after this procedure. She may have recurrent shoulder disability and may have to use PT again. She will likely need radiation therapy regardless      She agrees with this plan. We are going to tentatively  plan for right axillary lymph node dissection. She'll have a drain. She'll be observed overnight. She is going to talk with Dr. Lindi Adie because it is been 3 months since her CT chest which showed a tiny pulmonary nodule. She wants to make sure that the CT is repeated and that is stable before going ahead with any other surgery. I told her I would like to do the lymph node dissection in December.   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  Diagnostic Studies History  Colonoscopy  never Mammogram  within last year Pap Smear  1-5 years ago  Allergies  No Known Drug Allergies   Medication History Tylenol (500MG Capsule, Oral) Active. Ibuprofen (200MG Tablet, Oral) Active. Femara (2.5MG Tablet, Oral) Active. Medications Reconciled  Social History  Alcohol use  Occasional alcohol use. Caffeine use  Coffee. No drug use  Tobacco use  Never smoker.  Family History  Alcohol Abuse  Father. Anesthetic complications  Father. Arthritis  Father. Bleeding disorder  Father. Breast Cancer  Father. Cancer  Family Members In General, Father. Cerebrovascular Accident  Family Members In General, Father. Cervical Cancer  Family Members In General. Colon Cancer  Family Members In General, Father. Colon Polyps  Father. Depression  Father, Mother. Diabetes Mellitus  Father. Heart Disease  Father. Heart disease in female family member before age 68  Hypertension  Brother, Father, Mother, Sister. Ischemic Bowel Disease  Father. Kidney Disease  Family Members In General, Father. Malignant Neoplasm Of Pancreas  Father. Melanoma  Family Members In General, Father. Migraine Headache  Father. Prostate Cancer  Father. Rectal Cancer  Father. Respiratory Condition  Family Members In General, Father. Seizure disorder  Father. Thyroid problems  Family  Members In General, Father.  Pregnancy / Birth History  Age at menarche  47 years. Age of menopause  <45 Gravida  2 Irregular periods  Maternal age  33-30 Para  2  Other Problems  Breast Cancer  Lump In Breast   Vitals  Weight: 157.38 lb Height: 62in Body Surface Area: 1.73 m Body Mass Index: 28.78 kg/m  Pulse: 86 (Regular)  BP: 112/72 (Sitting, Left Arm, Standard)       Physical Exam  General Mental Status-Alert. General Appearance-Not in acute distress. Build & Nutrition-Well nourished. Posture-Normal posture. Gait-Normal.  Head and Neck Head-normocephalic, atraumatic with no lesions or palpable masses. Trachea-midline. Thyroid Gland Characteristics - normal size and consistency and no palpable nodules.  Chest and Lung Exam Chest and lung exam reveals -on auscultation, normal breath sounds, no adventitious sounds and normal vocal resonance.  Breast Note: Bilateral mastectomy incisions now well healed without signs of fluid or infection. No axillary mass or swelling. Excellent range of motion right shoulder without arm swelling. All drains had been previously removed.   Cardiovascular Cardiovascular examination reveals -normal heart sounds, regular rate and rhythm with no murmurs and femoral artery auscultation bilaterally reveals normal pulses, no bruits, no thrills.  Abdomen Inspection Inspection of the abdomen reveals - No Hernias. Palpation/Percussion Palpation and Percussion of the abdomen reveal - Soft, Non Tender, No Rigidity (guarding), No hepatosplenomegaly and No Palpable abdominal masses.  Neurologic Neurologic evaluation reveals -alert and oriented x 3 with no impairment of recent or remote memory, normal attention span and ability to concentrate, normal sensation and normal coordination.  Musculoskeletal Normal Exam - Bilateral-Upper Extremity Strength Normal and Lower Extremity Strength  Normal.    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 Lindi Adie and I advise you to have a complete axillary lymph node dissection I have discussed the indications, techniques, and risks of the surgery with you in detail once again  You have appropriate concerns about the side effects of the surgery Due to your young age we still advise this to lower your risk of axillary recurrence which is difficult to manage  you stated you're going to talk with Dr. Lindi Adie and repeat your CT scan of lung to make sure the pulmonary nodule is stable If the pulmonary nodule is stable I advised you to go ahead with the axillary lymph node dissection within the next 30 days You agree with this plan    Edsel Petrin. Dalbert Batman, M.D., [facility] General and Minimally invasive Surgery Breast and Colorectal Surgery Office:   [phone] Pager:   [phone]

## 2017-12-23 ENCOUNTER — Encounter (HOSPITAL_COMMUNITY)
Admission: RE | Disposition: A | Discharge status: Home / Self Care | Payer: Self-pay | Source: Home / Self Care | Attending: General Surgery

## 2017-12-23 ENCOUNTER — Ambulatory Visit (HOSPITAL_COMMUNITY)
Admission: RE | Admit: 2017-12-23 | Discharge: 2017-12-24 | Disposition: A | Discharge status: Home / Self Care | Payer: BLUE CROSS/BLUE SHIELD | Attending: General Surgery | Admitting: General Surgery

## 2017-12-23 ENCOUNTER — Encounter (HOSPITAL_COMMUNITY): Payer: Self-pay

## 2017-12-23 ENCOUNTER — Ambulatory Visit (HOSPITAL_COMMUNITY): Payer: BLUE CROSS/BLUE SHIELD | Admitting: Certified Registered Nurse Anesthetist

## 2017-12-23 ENCOUNTER — Other Ambulatory Visit: Payer: Self-pay

## 2017-12-23 DIAGNOSIS — C773 Secondary and unspecified malignant neoplasm of axilla and upper limb lymph nodes: Secondary | ICD-10-CM | POA: Insufficient documentation

## 2017-12-23 DIAGNOSIS — Z8249 Family history of ischemic heart disease and other diseases of the circulatory system: Secondary | ICD-10-CM | POA: Diagnosis not present

## 2017-12-23 DIAGNOSIS — Z79811 Long term (current) use of aromatase inhibitors: Secondary | ICD-10-CM | POA: Insufficient documentation

## 2017-12-23 DIAGNOSIS — C50411 Malignant neoplasm of upper-outer quadrant of right female breast: Secondary | ICD-10-CM | POA: Diagnosis not present

## 2017-12-23 DIAGNOSIS — I898 Other specified noninfective disorders of lymphatic vessels and lymph nodes: Secondary | ICD-10-CM | POA: Diagnosis not present

## 2017-12-23 DIAGNOSIS — Z170 Estrogen receptor positive status [ER+]: Secondary | ICD-10-CM

## 2017-12-23 HISTORY — PX: LYMPH NODE DISSECTION: SHX5087

## 2017-12-23 LAB — CBC
HCT: 39.3 % (ref 36.0–46.0)
Hemoglobin: 12.9 g/dL (ref 12.0–15.0)
MCH: 33 pg (ref 26.0–34.0)
MCHC: 32.8 g/dL (ref 30.0–36.0)
MCV: 100.5 fL — ABNORMAL HIGH (ref 80.0–100.0)
Platelets: 202 10*3/uL (ref 150–400)
RBC: 3.91 MIL/uL (ref 3.87–5.11)
RDW: 12.3 % (ref 11.5–15.5)
WBC: 9.3 10*3/uL (ref 4.0–10.5)
nRBC: 0 % (ref 0.0–0.2)

## 2017-12-23 LAB — CREATININE, SERUM
Creatinine, Ser: 0.56 mg/dL (ref 0.44–1.00)
GFR calc Af Amer: >60 mL/min (ref >60)
GFR calc non Af Amer: >60 mL/min (ref >60)

## 2017-12-23 SURGERY — LYMPH NODE DISSECTION
Anesthesia: General | Laterality: Right

## 2017-12-23 MED ORDER — CELECOXIB 200 MG PO CAPS
200.0000 mg | ORAL_CAPSULE | ORAL | Status: AC
  Administered 2017-12-23: 200 mg via ORAL
  Filled 2017-12-23: qty 1

## 2017-12-23 MED ORDER — ONDANSETRON HCL 4 MG/2ML IJ SOLN
INTRAMUSCULAR | Status: DC | PRN
  Administered 2017-12-23: 4 mg via INTRAVENOUS

## 2017-12-23 MED ORDER — CHLORHEXIDINE GLUCONATE CLOTH 2 % EX PADS: 6.0000 | MEDICATED_PAD | Freq: Once | CUTANEOUS | Status: DC

## 2017-12-23 MED ORDER — MIDAZOLAM HCL 5 MG/5ML IJ SOLN
INTRAMUSCULAR | Status: DC | PRN
  Administered 2017-12-23: 2 mg via INTRAVENOUS

## 2017-12-23 MED ORDER — ROCURONIUM BROMIDE 10 MG/ML (PF) SYRINGE
PREFILLED_SYRINGE | INTRAVENOUS | Status: AC
  Filled 2017-12-23: qty 10

## 2017-12-23 MED ORDER — FENTANYL CITRATE (PF) 100 MCG/2ML IJ SOLN
INTRAMUSCULAR | Status: AC
  Filled 2017-12-23: qty 2

## 2017-12-23 MED ORDER — BUPIVACAINE-EPINEPHRINE (PF) 0.25% -1:200000 IJ SOLN
INTRAMUSCULAR | Status: DC | PRN
  Administered 2017-12-23: 10 mL

## 2017-12-23 MED ORDER — HYDROMORPHONE HCL 1 MG/ML IJ SOLN
1.0000 mg | INTRAMUSCULAR | Status: DC | PRN
  Administered 2017-12-23: 1 mg via INTRAVENOUS
  Filled 2017-12-23: qty 1

## 2017-12-23 MED ORDER — BIOTIN 10000 MCG PO TABS: 10000.0000 ug | ORAL_TABLET | Freq: Every day | ORAL | Status: DC

## 2017-12-23 MED ORDER — OXYCODONE HCL 5 MG/5ML PO SOLN
5.0000 mg | Freq: Once | ORAL | Status: DC | PRN
  Filled 2017-12-23: qty 5

## 2017-12-23 MED ORDER — HYDROMORPHONE HCL 1 MG/ML IJ SOLN
0.2500 mg | INTRAMUSCULAR | Status: DC | PRN
  Administered 2017-12-23 (×2): 0.5 mg via INTRAVENOUS

## 2017-12-23 MED ORDER — 0.9 % SODIUM CHLORIDE (POUR BTL) OPTIME
TOPICAL | Status: DC | PRN
  Administered 2017-12-23: 1000 mL

## 2017-12-23 MED ORDER — OXYCODONE HCL 5 MG PO TABS: 5.0000 mg | ORAL_TABLET | Freq: Once | ORAL | Status: DC | PRN

## 2017-12-23 MED ORDER — GABAPENTIN 300 MG PO CAPS
300.0000 mg | ORAL_CAPSULE | ORAL | Status: AC
  Administered 2017-12-23: 300 mg via ORAL
  Filled 2017-12-23: qty 1

## 2017-12-23 MED ORDER — SCOPOLAMINE 1 MG/3DAYS TD PT72
MEDICATED_PATCH | TRANSDERMAL | Status: AC
  Filled 2017-12-23: qty 1

## 2017-12-23 MED ORDER — VITAMIN D 25 MCG (1000 UNIT) PO TABS: 5000.0000 [IU] | ORAL_TABLET | ORAL | Status: DC

## 2017-12-23 MED ORDER — PROPOFOL 10 MG/ML IV BOLUS
INTRAVENOUS | Status: DC | PRN
  Administered 2017-12-23: 180 mg via INTRAVENOUS

## 2017-12-23 MED ORDER — LIDOCAINE 2% (20 MG/ML) 5 ML SYRINGE
INTRAMUSCULAR | Status: AC
  Filled 2017-12-23: qty 10

## 2017-12-23 MED ORDER — LIDOCAINE 2% (20 MG/ML) 5 ML SYRINGE
INTRAMUSCULAR | Status: DC | PRN
  Administered 2017-12-23: 60 mg via INTRAVENOUS

## 2017-12-23 MED ORDER — VITAMIN B-12 1000 MCG PO TABS: 5000.0000 ug | ORAL_TABLET | Freq: Every day | ORAL | Status: DC

## 2017-12-23 MED ORDER — CEFAZOLIN SODIUM-DEXTROSE 2-4 GM/100ML-% IV SOLN
2.0000 g | Freq: Three times a day (TID) | INTRAVENOUS | Status: AC
  Administered 2017-12-23: 2 g via INTRAVENOUS
  Filled 2017-12-23 (×2): qty 100

## 2017-12-23 MED ORDER — PHENYLEPHRINE 40 MCG/ML (10ML) SYRINGE FOR IV PUSH (FOR BLOOD PRESSURE SUPPORT)
PREFILLED_SYRINGE | INTRAVENOUS | Status: DC | PRN
  Administered 2017-12-23 (×2): 80 ug via INTRAVENOUS

## 2017-12-23 MED ORDER — ONDANSETRON HCL 4 MG/2ML IJ SOLN
INTRAMUSCULAR | Status: AC
  Filled 2017-12-23: qty 2

## 2017-12-23 MED ORDER — SENNA 8.6 MG PO TABS
1.0000 | ORAL_TABLET | Freq: Two times a day (BID) | ORAL | Status: DC
  Administered 2017-12-23: 8.6 mg via ORAL
  Filled 2017-12-23: qty 1

## 2017-12-23 MED ORDER — BUPIVACAINE-EPINEPHRINE (PF) 0.25% -1:200000 IJ SOLN
INTRAMUSCULAR | Status: AC
  Filled 2017-12-23: qty 30

## 2017-12-23 MED ORDER — CEFAZOLIN SODIUM-DEXTROSE 2-4 GM/100ML-% IV SOLN
2.0000 g | INTRAVENOUS | Status: AC
  Administered 2017-12-23: 2 g via INTRAVENOUS
  Filled 2017-12-23: qty 100

## 2017-12-23 MED ORDER — OXYCODONE HCL 5 MG PO TABS
5.0000 mg | ORAL_TABLET | ORAL | Status: DC | PRN
  Administered 2017-12-23: 10 mg via ORAL
  Administered 2017-12-24: 5 mg via ORAL
  Filled 2017-12-23: qty 1
  Filled 2017-12-23: qty 2

## 2017-12-23 MED ORDER — TRAMADOL HCL 50 MG PO TABS: 50.0000 mg | ORAL_TABLET | Freq: Four times a day (QID) | ORAL | Status: DC | PRN

## 2017-12-23 MED ORDER — HYDROMORPHONE HCL 1 MG/ML IJ SOLN
INTRAMUSCULAR | Status: AC
  Filled 2017-12-23: qty 2

## 2017-12-23 MED ORDER — MIDAZOLAM HCL 2 MG/2ML IJ SOLN
INTRAMUSCULAR | Status: AC
  Filled 2017-12-23: qty 2

## 2017-12-23 MED ORDER — FENTANYL CITRATE (PF) 100 MCG/2ML IJ SOLN
INTRAMUSCULAR | Status: AC
  Filled 2017-12-23: qty 4

## 2017-12-23 MED ORDER — DEXAMETHASONE SODIUM PHOSPHATE 10 MG/ML IJ SOLN
INTRAMUSCULAR | Status: DC | PRN
  Administered 2017-12-23: 10 mg via INTRAVENOUS

## 2017-12-23 MED ORDER — FENTANYL CITRATE (PF) 250 MCG/5ML IJ SOLN
INTRAMUSCULAR | Status: DC | PRN
  Administered 2017-12-23: 25 ug via INTRAVENOUS
  Administered 2017-12-23: 100 ug via INTRAVENOUS
  Administered 2017-12-23: 25 ug via INTRAVENOUS

## 2017-12-23 MED ORDER — ONDANSETRON HCL 4 MG/2ML IJ SOLN: 4.0000 mg | Freq: Four times a day (QID) | INTRAMUSCULAR | Status: DC | PRN

## 2017-12-23 MED ORDER — DEXAMETHASONE SODIUM PHOSPHATE 10 MG/ML IJ SOLN
INTRAMUSCULAR | Status: AC
  Filled 2017-12-23: qty 1

## 2017-12-23 MED ORDER — LIDOCAINE 2% (20 MG/ML) 5 ML SYRINGE
INTRAMUSCULAR | Status: DC | PRN
  Administered 2017-12-23: 1.5 mg/kg/h via INTRAVENOUS

## 2017-12-23 MED ORDER — INFLUENZA VAC SPLIT QUAD 0.5 ML IM SUSY: 0.5000 mL | PREFILLED_SYRINGE | INTRAMUSCULAR | Status: DC

## 2017-12-23 MED ORDER — ENOXAPARIN SODIUM 40 MG/0.4ML ~~LOC~~ SOLN: 40.0000 mg | SUBCUTANEOUS | Status: DC

## 2017-12-23 MED ORDER — LACTATED RINGERS IV SOLN
INTRAVENOUS | Status: DC
  Administered 2017-12-23 (×2): via INTRAVENOUS

## 2017-12-23 MED ORDER — ROPIVACAINE HCL 5 MG/ML IJ SOLN
INTRAMUSCULAR | Status: DC | PRN
  Administered 2017-12-23: 30 mL via PERINEURAL

## 2017-12-23 MED ORDER — LACTATED RINGERS IV SOLN
INTRAVENOUS | Status: DC
  Administered 2017-12-23: 12:00:00 via INTRAVENOUS

## 2017-12-23 MED ORDER — METHOCARBAMOL 1000 MG/10ML IJ SOLN
500.0000 mg | Freq: Three times a day (TID) | INTRAVENOUS | Status: DC | PRN
  Administered 2017-12-23 (×2): 500 mg via INTRAVENOUS
  Filled 2017-12-23: qty 500
  Filled 2017-12-23: qty 5
  Filled 2017-12-23: qty 500

## 2017-12-23 MED ORDER — ONDANSETRON 4 MG PO TBDP: 4.0000 mg | ORAL_TABLET | Freq: Four times a day (QID) | ORAL | Status: DC | PRN

## 2017-12-23 MED ORDER — FENTANYL CITRATE (PF) 100 MCG/2ML IJ SOLN
25.0000 ug | INTRAMUSCULAR | Status: DC | PRN
  Administered 2017-12-23 (×3): 50 ug via INTRAVENOUS

## 2017-12-23 MED ORDER — ACETAMINOPHEN 500 MG PO TABS
1000.0000 mg | ORAL_TABLET | ORAL | Status: AC
  Administered 2017-12-23: 1000 mg via ORAL
  Filled 2017-12-23: qty 2

## 2017-12-23 SURGICAL SUPPLY — 42 items
APPLIER CLIP 11 MED OPEN (CLIP) ×4
APR CLP MED 11 20 MLT OPN (CLIP) ×2
BENZOIN TINCTURE PRP APPL 2/3 (GAUZE/BANDAGES/DRESSINGS) ×2 IMPLANT
BIOPATCH WHT 1IN DISK W/4.0 H (GAUZE/BANDAGES/DRESSINGS) ×2 IMPLANT
BLADE HEX COATED 2.75 (ELECTRODE) ×2 IMPLANT
BLADE SURG 15 STRL LF DISP TIS (BLADE) ×1 IMPLANT
BLADE SURG 15 STRL SS (BLADE) ×1
BLADE SURG SZ10 CARB STEEL (BLADE) ×2 IMPLANT
CLIP APPLIE 11 MED OPEN (CLIP) ×2 IMPLANT
COVER SURGICAL LIGHT HANDLE (MISCELLANEOUS) ×2 IMPLANT
COVER WAND RF STERILE (DRAPES) ×2 IMPLANT
DECANTER SPIKE VIAL GLASS SM (MISCELLANEOUS) IMPLANT
DERMABOND ADVANCED (GAUZE/BANDAGES/DRESSINGS) ×1
DERMABOND ADVANCED .7 DNX12 (GAUZE/BANDAGES/DRESSINGS) ×1 IMPLANT
DRAIN CHANNEL 19F RND (DRAIN) ×2 IMPLANT
DRAPE LAPAROSCOPIC ABDOMINAL (DRAPES) IMPLANT
DRAPE LAPAROTOMY TRNSV 102X78 (DRAPE) ×2 IMPLANT
DRAPE SHEET LG 3/4 BI-LAMINATE (DRAPES) ×2 IMPLANT
ELECT PENCIL ROCKER SW 15FT (MISCELLANEOUS) ×2 IMPLANT
ELECT REM PT RETURN 15FT ADLT (MISCELLANEOUS) ×2 IMPLANT
EVACUATOR SILICONE 100CC (DRAIN) ×2 IMPLANT
GAUZE 4X4 16PLY RFD (DISPOSABLE) IMPLANT
GAUZE SPONGE 4X4 12PLY STRL (GAUZE/BANDAGES/DRESSINGS) ×2 IMPLANT
GLOVE BIOGEL PI IND STRL 7.0 (GLOVE) ×1 IMPLANT
GLOVE BIOGEL PI INDICATOR 7.0 (GLOVE) ×1
GLOVE EUDERMIC 7 POWDERFREE (GLOVE) ×2 IMPLANT
GOWN STRL REUS W/TWL LRG LVL3 (GOWN DISPOSABLE) IMPLANT
GOWN STRL REUS W/TWL XL LVL3 (GOWN DISPOSABLE) ×2 IMPLANT
KIT BASIN OR (CUSTOM PROCEDURE TRAY) ×2 IMPLANT
MARKER SKIN DUAL TIP RULER LAB (MISCELLANEOUS) ×2 IMPLANT
NEEDLE HYPO 25X1 1.5 SAFETY (NEEDLE) ×2 IMPLANT
NS IRRIG 1000ML POUR BTL (IV SOLUTION) ×2 IMPLANT
PACK BASIC VI WITH GOWN DISP (CUSTOM PROCEDURE TRAY) ×2 IMPLANT
SPONGE LAP 18X18 RF (DISPOSABLE) ×2 IMPLANT
STRIP CLOSURE SKIN 1/2X4 (GAUZE/BANDAGES/DRESSINGS) ×2 IMPLANT
SUT ETHILON 3 0 PS 1 (SUTURE) ×2 IMPLANT
SUT MNCRL AB 4-0 PS2 18 (SUTURE) ×2 IMPLANT
SUT VIC AB 3-0 SH 18 (SUTURE) ×2 IMPLANT
SYR BULB IRRIGATION 50ML (SYRINGE) ×2 IMPLANT
SYR CONTROL 10ML LL (SYRINGE) ×2 IMPLANT
TOWEL OR 17X26 10 PK STRL BLUE (TOWEL DISPOSABLE) ×2 IMPLANT
YANKAUER SUCT BULB TIP 10FT TU (MISCELLANEOUS) ×2 IMPLANT

## 2017-12-23 NOTE — Op Note (Signed)
Patient Name:           Sabrina Spencer   Date of Surgery:        12/23/2017  Pre op Diagnosis:     Cancer upper outer quadrant right breast, axillary lymph node metastasis  Post op Diagnosis:    Same  Procedure:                 Complete level 1 and level 2 right axillary lymph node dissection  Surgeon:                     Edsel Petrin. Dalbert Batman, M.D., FACS  Assistant:                      Or staff  Operative Indications:   This is a 47 year old female who is brought to the operating room for elective right axillary lymph node dissection.  Sabrina Spencer is her PCP. Dr. Lindi Adie and Dr. Sondra Come are involved in her care.      On October 15, 2017 she underwent right total mastectomy with sentinel biopsy and left prophylactic mastectomy. The left side reveals only benign radial scar. 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 This was a sentinel lymph node mapping. Receptors positive but weak. HER-2 negative. Mammoprint reportedly low risk. Dr. Lindi Adie and I have discussed her care and because of her young age we have advised complete right axillary lymph node dissection to complete her surgical staging. We have discussed this with her and she is in agreement.      We had a long discussion. We talked about the advantages and risks of complete axillary lymph node dissection. Basically I advised her to have this as the standard of care in her situation. We would like to avoid axillary recurrence which is very difficult to manage. She knows that she is at increased risk for arm swelling and arm numbness after this procedure. She may have recurrent shoulder disability and may have to use PT again. She will likely need radiation therapy regardless      She agrees with this plan.   Operative Findings:       There was moderate scar tissue from her recent mastectomy and sentinel node biopsy.  I was able to define all of her anatomic boundaries  and do a complete level 1 and level 2 lymph node dissection.  None of the lymph nodes appeared pathologically enlarged.  Thoracodorsal neurovascular bundle and long thoracic nerve were preserved and functioning at the end of the case.  Procedure in Detail:          Following the induction of general LMA anesthesia the patient's right chest wall and axilla were prepped and draped in a sterile fashion.  Intravenous antibiotics were given.  Surgical timeout was performed.  A right pectoral block had been performed.  0.25% Marcaine with epinephrine was used as a local infiltration anesthetic.      A transverse incision was made at the hairline in the right axilla.  Dissection was carried down through the subcutaneous tissue.  I incised the clavipectoral fascia.  Scar tissue was encountered.  We took the dissection slowly down to the lateral border of the pectoralis major muscle.  We then dissected all of the tissues away on the lateral border of the pectoralis major and minor muscles.  Retractors were placed.  We took the dissection up until we  identified the axillary vein.  We then dissected all of the tissues out inferior to the axillary vein and from the chest wall out to just beyond the thoracodorsal neurovascular bundle.  Venous tributaries were controlled with metal clips and divided.  Some electrocautery was used.  After we removed all of the tissue we irrigated the wound and made sure there was complete hemostasis.  This looked good.  A 19 [place] Blake drain was placed up into the axillary space and brought out through 1 of her old drain sites inferiorly.  The drain was sutured in place and connected to a suction bulb.  The clavipectoral fascia was closed with 3-0 Vicryl sutures and the skin closed with a running subcuticular 4-0 Monocryl and Dermabond.  After the Dermabond dried dry bandages were placed and the patient was taken to PACU in stable condition.  EBL 30 to 40 cc.  Counts correct.  Complications  none.    Addendum: I logged onto the [employer] and reviewed her prescription medication history.     Edsel Petrin. Dalbert Batman, M.D., FACS General and Minimally Invasive Surgery Breast and Colorectal Surgery  12/23/2017 9:49 AM

## 2017-12-23 NOTE — Interval H&P Note (Signed)
History and Physical Interval Note:  12/23/2017 8:07 AM  Sabrina Spencer  has presented today for surgery, with the diagnosis of Right breast cancer, upper outer quadrant  The various methods of treatment have been discussed with the patient and family. After consideration of risks, benefits and other options for treatment, the patient has consented to  Procedure(s): [place] (Right) as a surgical intervention .  The patient's history has been reviewed, patient examined, no change in status, stable for surgery.  I have reviewed the patient's chart and labs.  Questions were answered to the patient's satisfaction.     [name]

## 2017-12-23 NOTE — Anesthesia Procedure Notes (Signed)
Anesthesia Regional Block: Pectoralis block   Pre-Anesthetic Checklist: ,, timeout performed, Correct Patient, Correct Site, Correct Laterality, Correct Procedure, Correct Position, site marked, Risks and benefits discussed,  Surgical consent,  Pre-op evaluation,  At surgeon's request and post-op pain management  Laterality: Right  Prep: chloraprep       Needles:  Injection technique: Single-shot  Needle Type: Echogenic Needle     Needle Length: 9cm  Needle Gauge: 21     Additional Needles:   Narrative:  Start time: 12/23/2017 8:05 AM End time: 12/23/2017 8:15 AM Injection made incrementally with aspirations every 5 mL.  Performed by: Personally  Anesthesiologist: Albertha Ghee, MD  Additional Notes: Pt tolerated the procedure well.

## 2017-12-23 NOTE — Progress Notes (Signed)
Location of Breast Cancer: upper outer right breast  Histology per Pathology Report: 07/30/17:  Diagnosis 1. 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 2. Lymph node, needle/core biopsy, right axillary inferior LYMPH NODE, NEGATIVE FOR CARCINOMA.  10/13/17:  Diagnosis 1. Breast, simple mastectomy, Left - RADIAL SCAR. - FIBROCYSTIC CHANGES. - THERE IS NO EVIDENCE OF MALIGNANCY. 2. Breast, simple mastectomy, Right - INVASIVE DUCTAL CARCINOMA, GRADE II/III, SPANNING 4.0 CM. - LYMPHOVASCULAR INVASION IS IDENTIFIED. - THE SURGICAL RESECTION MARGINS ARE NEGATIVE FOR CARCINOMA - METASTATIC CARCINOMA IN 1 OF 1 LYMPH NODE (1/1). - SEE ONCOLOGY TABLE BELOW. 3. Lymph node, sentinel, biopsy, Right axillary - THERE IS NO EVIDENCE OF CARCINOMA IN 1 OF 1 LYMPH NODE (0/1). 4. Lymph node, sentinel, biopsy, Right axillary - THERE IS NO EVIDENCE OF CARCINOMA IN 1 OF 1 LYMPH NODE (0/1). 5. Lymph node, sentinel, biopsy, Right axillary - THERE IS NO EVIDENCE OF CARCINOMA IN 1 OF 1 LYMPH NODE (0/1). 6. Lymph node, sentinel, biopsy, Right axillary - THERE IS NO EVIDENCE OF CARCINOMA IN 1 OF 1 LYMPH NODE (0/1). 7. Lymph node, sentinel, biopsy, Right axillary - THERE IS NO EVIDENCE OF CARCINOMA IN 1 OF 1 LYMPH NODE (0/1). 8. Lymph node, sentinel, biopsy, Right axillary - METASTATIC CARCINOMA IN 1 OF 1 LYMPH NODE (1/1).  Receptor Status: ER(60%), PR (60%), Her2-neu (negative), Ki-(5%)  Did patient present with symptoms (if so, please note symptoms) or was this found on screening mammography?: Sabrina Spencer is a 47 y.o. female who presented for diagnostic mammogram and breast ultrasound on 07/27/2017 after self-palpating a large mass in the upper outer right breast x2 weeks. These scans revealed a 4.1 cm palpable mass in the 10 o'clock position of the right breast with imaging features highly suspicious for  malignancy. As well as a single abnormal appearing right axillary lymph node suspicious for a metastatic lymph node. No evidence of malignancy on the left.  Biopsy of the right breast at 10 o'clock position on 07/30/2017 revealed invasive ductal carcinoma with micropapillary features and extravasated mucin, Grade II. ER 60% / PR 60% / Ki-67 5% / HER-2 negative. Right axillary inferior lymph node sampled was negative for carcinoma. 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  Breast MRI on 08/11/2017 shows 4 x 4.5 x 4.5 cm biopsy-proven malignancy within the upper-outer right breast with irregular nodular non masslike enhancement extends anteriorly and inferiorly from this biopsy-proven cancer highly suspicious for malignancy as well. The entire suspicious area, including the biopsy-proven cancer, measures 4.5 x 7.5 x 5.5 cm. If breast conservation is desired, consider tissue sampling of the anterior most suspicious non masslike enhancement within the outer retroareolar right breast. No MR evidence of left breast malignancy or suspicious lymph nodes.  Past/Anticipated interventions by surgeon, if any: 10/13/17:  Procedure: Inject blue dye right breast  Right total mastectomy  Right axillary deep sentinel lymph node biopsy  Left prophylactic mastectomy  Surgeon: Edsel Petrin. Dalbert Batman, M.D., FACS   12/23/17:  RIGHT AXILLARY LYMPH NODE DISSECTION ERAS PATHWAY (Right) Surgeon: Edsel Petrin. Dalbert Batman, M.D., [facility]    Past/Anticipated interventions by medical oncology, if any: Chemotherapy Per Dr. Lindi Adie 11/05/17: Treatment plan: 1.  Completion axillary lymph node dissection  2. adjuvant radiation 3. followed by adjuvant antiestrogen therapy will resume letrozole 2.5 mg daily for 7 years.  Patient took neoadjuvant letrozole therapy and tolerated it well.  Return to clinic after surgery to discuss pathology report   Lymphedema issues, if any:  Per  Physical Therapy note 12/14/17:   Plan - 12/14/17 0846    Clinical Impression Statement  Shoulder ROM bilateral full and ready for ALND next week.  Pt does have some new Rt shoulder pain which she attributes to doing 20pounds at the gym recently.  It is starting to feel better.  Is also still having back pain and wanted some core exercises to work on while recvering from surgery and to decrease back pain.  Gave pt level 1 activation activities with good performance      PT Next Visit Plan  will re assess after surgery if pt returns         Pain issues, if any:  "soreness" in axilla related to recent lymph node dissection. Rated 5/10 on pain scale.  SAFETY ISSUES:  Prior radiation? No  Pacemaker/ICD? No  Possible current pregnancy? No  Is the patient on methotrexate? No  Current Complaints / other details:  Pt presents for follow up new for Radiation Oncology with Dr. Sondra Come. Pt is accompanied by husband.   BP 104/75 (BP Location: Left Arm, Patient Position: Sitting)   Pulse 80   Temp 98.1 F (36.7 C) (Oral)   Resp 20   Ht 5' 2" (1.575 m)   Wt 160 lb 3.2 oz (72.7 kg)   SpO2 99%   BMI 29.30 kg/m   Wt Readings from Last 3 Encounters:  01/04/18 160 lb 3.2 oz (72.7 kg)  12/23/17 155 lb 6 oz (70.5 kg)  12/14/17 157 lb 3.2 oz (71.3 kg)       Loma Sousa, RN 01/04/2018,2:29 PM

## 2017-12-23 NOTE — Anesthesia Preprocedure Evaluation (Signed)
Anesthesia Evaluation  Patient identified by MRN, date of birth, ID band Patient awake    Reviewed: Allergy & Precautions, H&P , NPO status , Patient's Chart, lab work & pertinent test results  History of Anesthesia Complications (+) PONV and history of anesthetic complications  Airway Mallampati: II   Neck ROM: full    Dental   Pulmonary neg pulmonary ROS,    breath sounds clear to auscultation       Cardiovascular negative cardio ROS   Rhythm:regular Rate:Normal     Neuro/Psych    GI/Hepatic   Endo/Other    Renal/GU      Musculoskeletal  (+) Arthritis ,   Abdominal   Peds  Hematology  (+) Blood dyscrasia, anemia ,   Anesthesia Other Findings   Reproductive/Obstetrics Breast CA                            Anesthesia Physical Anesthesia Plan  ASA: II  Anesthesia Plan: General   Post-op Pain Management:  Regional for Post-op pain   Induction: Intravenous  PONV Risk Score and Plan: 4 or greater and Ondansetron, Dexamethasone, Midazolam, Scopolamine patch - Pre-op and Treatment may vary due to age or medical condition  Airway Management Planned: LMA  Additional Equipment:   Intra-op Plan:   Post-operative Plan:   Informed Consent: I have reviewed the patients History and Physical, chart, labs and discussed the procedure including the risks, benefits and alternatives for the proposed anesthesia with the patient or authorized representative who has indicated his/her understanding and acceptance.     Plan Discussed with: CRNA, Anesthesiologist and Surgeon  Anesthesia Plan Comments:         Anesthesia Quick Evaluation

## 2017-12-23 NOTE — Anesthesia Postprocedure Evaluation (Signed)
Anesthesia Post Note  Patient: Sabrina Spencer  Procedure(s) Performed: RIGHT AXILLARY LYMPH NODE DISSECTION ERAS PATHWAY (Right )     Patient location during evaluation: PACU Anesthesia Type: General Level of consciousness: awake and alert Pain management: pain level controlled Vital Signs Assessment: post-procedure vital signs reviewed and stable Respiratory status: spontaneous breathing, nonlabored ventilation, respiratory function stable and patient connected to nasal cannula oxygen Cardiovascular status: blood pressure returned to baseline and stable Postop Assessment: no apparent nausea or vomiting Anesthetic complications: no    Last Vitals:  Vitals:   12/23/17 1045 12/23/17 1100  BP: 128/88 111/76  Pulse: 95 60  Resp: 19 12  Temp: 36.4 C   SpO2: 99% 97%    Last Pain:  Vitals:   12/23/17 1100  TempSrc:   PainSc: [place]

## 2017-12-23 NOTE — Transfer of Care (Signed)
Immediate Anesthesia Transfer of Care Note  Patient: Sabrina Spencer  Procedure(s) Performed: RIGHT AXILLARY LYMPH NODE DISSECTION ERAS PATHWAY (Right )  Patient Location: PACU  Anesthesia Type:General  Level of Consciousness: awake, alert , oriented and patient cooperative  Airway & Oxygen Therapy: Patient Spontanous Breathing and Patient connected to face mask oxygen  Post-op Assessment: Report given to RN, Post -op Vital signs reviewed and stable and Patient moving all extremities  Post vital signs: Reviewed and stable  Last Vitals:  Vitals Value Taken Time  BP 121/85 12/23/2017  9:55 AM  Temp 36.7 C 12/23/2017  9:55 AM  Pulse 73 12/23/2017  9:58 AM  Resp 0 12/23/2017  9:58 AM  SpO2 100 % 12/23/2017  9:58 AM  Vitals shown include unvalidated device data.  Last Pain:  Vitals:   12/23/17 0955  TempSrc:   PainSc: (P) 5       Patients Stated Pain Goal: 5 (38/46/65 9935)  Complications: No apparent anesthesia complications

## 2017-12-23 NOTE — Anesthesia Procedure Notes (Signed)
Procedure Name: LMA Insertion Date/Time: 12/23/2017 8:45 AM Performed by: Mitzie Na, CRNA Pre-anesthesia Checklist: Patient identified, Emergency Drugs available, Suction available, Patient being monitored and Timeout performed Patient Re-evaluated:Patient Re-evaluated prior to induction Oxygen Delivery Method: Circle system utilized Preoxygenation: Pre-oxygenation with 100% oxygen Induction Type: IV induction LMA: LMA inserted LMA Size: 3.0 Number of attempts: 1 Placement Confirmation: positive ETCO2 and breath sounds checked- equal and bilateral Tube secured with: Tape Dental Injury: Teeth and Oropharynx as per pre-operative assessment

## 2017-12-24 ENCOUNTER — Encounter (HOSPITAL_COMMUNITY): Payer: Self-pay | Admitting: General Surgery

## 2017-12-24 DIAGNOSIS — C50411 Malignant neoplasm of upper-outer quadrant of right female breast: Secondary | ICD-10-CM | POA: Diagnosis not present

## 2017-12-24 LAB — BASIC METABOLIC PANEL
Anion gap: 6 (ref 5–15)
BUN: 9 mg/dL (ref 6–20)
CO2: 27 mmol/L (ref 22–32)
Calcium: 9.1 mg/dL (ref 8.9–10.3)
Chloride: 106 mmol/L (ref 98–111)
Creatinine, Ser: 0.61 mg/dL (ref 0.44–1.00)
GFR calc Af Amer: >60 mL/min (ref >60)
GFR calc non Af Amer: >60 mL/min (ref >60)
Glucose, Bld: 113 mg/dL — ABNORMAL HIGH (ref 70–99)
Potassium: 4.5 mmol/L (ref 3.5–5.1)
Sodium: 139 mmol/L (ref 135–145)

## 2017-12-24 LAB — CBC
HCT: 39.1 % (ref 36.0–46.0)
Hemoglobin: 12.6 g/dL (ref 12.0–15.0)
MCH: 32.1 pg (ref 26.0–34.0)
MCHC: 32.2 g/dL (ref 30.0–36.0)
MCV: 99.7 fL (ref 80.0–100.0)
Platelets: 218 10*3/uL (ref 150–400)
RBC: 3.92 MIL/uL (ref 3.87–5.11)
RDW: 12.2 % (ref 11.5–15.5)
WBC: 13 10*3/uL — ABNORMAL HIGH (ref 4.0–10.5)
nRBC: 0 % (ref 0.0–0.2)

## 2017-12-24 MED ORDER — HYDROCODONE-ACETAMINOPHEN 5-325 MG PO TABS: 1.0000 | ORAL_TABLET | Freq: Four times a day (QID) | ORAL | 0 refills | Status: AC | PRN

## 2017-12-24 NOTE — Discharge Summary (Signed)
Patient ID: Sabrina Spencer [id number] 47 y.o. [birth year]  Admit date: 12/23/2017  Discharge date and time: 12/24/2017  Admitting Physician: [name]  Discharge Physician: [name]  Admission Diagnoses: Right breast cancer, upper outer quadrant  Discharge Diagnoses: Primary cancer upper outer quadrant of right female breast                                         Right axillary lymph node metastasis  Operations: Procedure(s): RIGHT AXILLARY LYMPH NODE DISSECTION ERAS PATHWAY  Admission Condition: good  Discharged Condition: good  Indication for Admission: This is a 47 year old female who is brought to the operating room for elective right axillary lymph node dissection.  Avon Gully is her PCP. Dr. Lindi Adie and Dr. Sondra Come are involved in her care. On October 15, 2017 she underwent right total mastectomy with sentinel biopsy and left prophylactic mastectomy. The left side reveals only benign radial scar. 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 This was a sentinel lymph node mapping. Receptors positive but weak. HER-2 negative. Mammoprint reportedly low risk. Dr. Lindi Adie and I have discussed her care and because of her young age we have advised complete right axillary lymph node dissection to complete her surgical staging. We have discussed this with her and she is in agreement. We had a long discussion. We talked about the advantages and risks of complete axillary lymph node dissection. Basically I advised her to have this as the standard of care in her situation. We would like to avoid axillary recurrence which is very difficult to manage. She knows that she is at increased risk for arm swelling and arm numbness after this procedure. She may have recurrent shoulder disability and may have to use PT again. She will likely need radiation therapy regardless She agrees with this plan.    Hospital Course: On the day of admission the patient was taken to the operating room and underwent complete level 1 and level 2 right axillary lymph node dissection.  The surgery was uneventful.  There were no grossly abnormal lymph nodes.  A drain was left in place     The patient was admitted overnight for observation and did very well.  Pain control was good.  She became ambulatory in the hall.  Had no trouble with diet or activities or voiding.  She was moving her shoulder around fairly well.  On postop day 1 she looked good and wanted to go home.  Shoulder range of motion was good.  The right axillary wound was soft.  No hematoma or swelling.  Drainage was thin serosanguineous.      Diet and activities were discussed.  Wound care and drain care were discussed.  I called in a prescription for Norco to her pharmacy electronically.  She was continue all of her usual medications.  She has an appointment with Dr. Lindi Adie next week.  I told her to make an appointment to see [place] in 7 to 8 days for a wound and drain check.  Possibly can remove the drain next week  Consults: None  Significant Diagnostic Studies: Surgical pathology, pending  Treatments: surgery: Complete level 1 and level 2 right axillary lymph node dissection  Disposition: Home  Patient Instructions:  Allergies as of 12/24/2017   No Known Allergies     Medication List  TAKE these medications   Biotin 10000 MCG Tabs Take 10,000 mcg by mouth daily.   Cyanocobalamin 5000 MCG Caps Take 5,000 mcg by mouth daily.   HYDROcodone-acetaminophen 5-325 MG tablet Commonly known as:  NORCO Take 1-2 tablets by mouth every 6 (six) hours as needed for moderate pain or severe pain.   letrozole 2.5 MG tablet Commonly known as:  FEMARA Take 1 tablet (2.5 mg total) by mouth daily. What changed:  when to take this   Vitamin D3 125 MCG (5000 UT) Caps Take 5,000 Units by mouth 2 (two) times a week.            Discharge Care  Instructions  (From admission, onward)         Start     Ordered   12/24/17 0000  Discharge wound care:    Comments:  Keep a written record of the drainage and bring that to the office with you If the bandage gets wet or dirty, replace it with completely dry gauze  If desired, you may take a quick shower starting on Saturday  Move your shoulder around frequently as discussed   12/24/17 0734          Activity: No sports or heavy lifting for 3 or 4 weeks.  No driving for 1 week.  Otherwise encourage ambulation Diet: regular diet Wound Care: as directed  Follow-up:  With Dr. Dalbert Batman in 7-8 days.   Addendum: I logged into the [employer] website and reviewed her prescription medication history  Signed: Edsel Petrin. Dalbert Batman, M.D., FACS General and minimally invasive surgery Breast and Colorectal Surgery  12/24/2017, 7:34 AM

## 2017-12-25 ENCOUNTER — Telehealth: Payer: Self-pay

## 2017-12-25 NOTE — Telephone Encounter (Signed)
Reviewed biopsy results with Dr. Lindi Adie, pathology negative.  Nurse informed patient of normal results.  Patient voiced understanding and agreement.

## 2017-12-25 NOTE — Telephone Encounter (Signed)
Called pt who had conveyed to Dr. Geralyn Flash nurse that pt had questions for Dr. Sondra Come. Pt would like to know if she can have tissue expanders placed prior to radiation. Conveyed to pt that Dr. Sondra Come was out of office until her appt on 12/30. Pt verbalized understanding and agreement. Loma Sousa, RN BSN

## 2017-12-31 ENCOUNTER — Ambulatory Visit: Payer: BLUE CROSS/BLUE SHIELD | Admitting: Hematology and Oncology

## 2018-01-04 ENCOUNTER — Ambulatory Visit
Admission: RE | Admit: 2018-01-04 | Discharge: 2018-01-04 | Disposition: A | Discharge status: Home / Self Care | Payer: BLUE CROSS/BLUE SHIELD | Source: Ambulatory Visit | Attending: Radiation Oncology | Admitting: Radiation Oncology

## 2018-01-04 ENCOUNTER — Encounter (INDEPENDENT_AMBULATORY_CARE_PROVIDER_SITE_OTHER): Payer: Self-pay

## 2018-01-04 ENCOUNTER — Other Ambulatory Visit: Payer: Self-pay

## 2018-01-04 ENCOUNTER — Encounter: Payer: Self-pay | Admitting: Radiation Oncology

## 2018-01-04 VITALS — BP 104/75 | HR 80 | Temp 98.1°F | Resp 20 | Ht 62.0 in | Wt 160.2 lb

## 2018-01-04 DIAGNOSIS — C50411 Malignant neoplasm of upper-outer quadrant of right female breast: Secondary | ICD-10-CM | POA: Diagnosis not present

## 2018-01-04 DIAGNOSIS — Z170 Estrogen receptor positive status [ER+]: Secondary | ICD-10-CM | POA: Diagnosis not present

## 2018-01-04 DIAGNOSIS — Z79899 Other long term (current) drug therapy: Secondary | ICD-10-CM | POA: Diagnosis not present

## 2018-01-04 DIAGNOSIS — Z923 Personal history of irradiation: Secondary | ICD-10-CM | POA: Insufficient documentation

## 2018-01-04 NOTE — Progress Notes (Signed)
Radiation Oncology         [id number]) [phone] ________________________________  Name: Sabrina Spencer MRN: [id number]  Date: 01/04/2018  DOB: 08-21-1970  Re-evaluation Note  CC: Avon Gully, NP  Fanny Skates, MD    ICD-10-CM   1. Malignant neoplasm of upper-outer quadrant of right breast in female, estrogen receptor positive ([place]) C50.411    Z17.0     Diagnosis:  Invasive ductal carcinoma of the right breast, with micropapillary features. ER/PR positive. Clinical stage T3N0. Pathologic Stage: pT2, pN1a    Narrative:  The patient returns today for further evaluation. The patient was initially seen in the multidisciplinary breast clinic. Since that evaluation the patient has undergone 2 surgeries as described below  Since they were last seen in the office, they had a mammaprint on 10/13/2017 that was low-risk.   She also had a bilateral mastectomy on 10/13/2017 with pathology showing: Breast, simple mastectomy, Left with radial scar. Fibrocystic changes. There is no evidence of malignancy. Breast, simple mastectomy, Right with invasive ductal carcinoma, grade II/III, spanning 4.0 cm. Lymphovascular invasion is identified. The surgical resection margins are negative for carcinoma. Metastatic carcinoma in 1 of 1 lymph node (1/1). Lymph node, sentinel, biopsy, Right axillary with no evidence of carcinoma in 1 of 1 lymph node (0/1). Lymph node, sentinel, biopsy, Right axillary with no evidence of carcinoma in 1 of 1 lymph node (0/1). Lymph node, sentinel, biopsy, Right axillary with no evidence of carcinoma in 1 of 1 lymph node (0/1). Lymph node, sentinel, biopsy, Right axillary with no evidence of carcinoma in 1 of 1 lymph node (0/1). Lymph node, sentinel, biopsy, Right axillary with no evidence of carcinoma in 1 of 1 lymph node (0/1). Lymph node, sentinel, biopsy, Right axillary with metastatic carcinoma in 1 of 1 lymph node (1/1). Grade II. Estrogen Receptor: 60%, moderate-weak staining,  Progesterone Receptor: 60, moderate-weak staining, and Ki-67 at 5%.         On 12/02/2017, she had a CT Chest w contrast that showed: Status post bilateral mastectomy with right axillary lymph node dissection. No findings specific for metastatic disease. Small left axillary lymph nodes measuring up to 5 mm short axis, within normal limits. Small bilateral pulmonary nodules measuring up to 5.5 mm (mean diameter), unchanged and likely benign.   On 12/23/2017 she had lymph node resection that showed: Lymph nodes, regional resection, right axillary contents(right breast cancer) with eleven lymph nodes, negative for carcinoma (0/11). Multifocal areas of fibrosis with biopsy site changes and organizing fat necrosis.   On review of systems, she reports right arm soreness today, mild numbness to her right upper arm. She is not having any issues with movement of her right arm. She hasn't had any drains placed to her arm. She would like to have tissue expanders in place and she recently met with Dr. Harlow Mares. she denies any other symptoms. Pertinent positives are listed and detailed within the above HPI.                 ALLERGIES:  has No Known Allergies.  Meds: Current Outpatient Medications  Medication Sig Dispense Refill  . Biotin 10000 MCG TABS Take 10,000 mcg by mouth daily.    . Cholecalciferol (VITAMIN D3) 5000 units CAPS Take 5,000 Units by mouth 2 (two) times a week.     . Cyanocobalamin 5000 MCG CAPS Take 5,000 mcg by mouth daily.     Marland Kitchen HYDROcodone-acetaminophen (NORCO) 5-325 MG tablet Take 1-2 tablets by mouth every 6 (six) hours  as needed for moderate pain or severe pain. 20 tablet 0  . letrozole (FEMARA) 2.5 MG tablet Take 1 tablet (2.5 mg total) by mouth daily. (Patient taking differently: Take 2.5 mg by mouth at bedtime. ) 90 tablet 3   No current facility-administered medications for this encounter.     Physical Findings: The patient is in no acute distress. Patient is alert and  oriented.  height is '5\' 2"'$  (1.575 m) and weight is 160 lb 3.2 oz (72.7 kg). Her oral temperature is 98.1 F (36.7 C). Her blood pressure is 104/75 and her pulse is 80. Her respiration is 20 and oxygen saturation is 99%.   Lungs are clear to auscultation bilaterally. Heart has regular rate and rhythm. No palpable cervical, supraclavicular, or axillary adenopathy. Abdomen soft, non-tender, normal bowel sounds. Left chest wall reveals mastectomy scar which is healing well without signs of drainage or infection. Right chest wall shows a mastectomy scar which is also healing well without signs of drainage or infection. Patient has a separate scar in the axillary regions from her recent axillary node dissection. Patient has some swelling of the soft tissues along the right lateral chest wall area, but no signs of infection. Good ROM in bilateral arms and shoulders at this time.   Lab Findings: Lab Results  Component Value Date   WBC 13.0 (H) 12/24/2017   HGB 12.6 12/24/2017   HCT 39.1 12/24/2017   MCV 99.7 12/24/2017   PLT 218 12/24/2017    Radiographic Findings: No results found.  Impression:    Invasive ductal carcinoma of the right breast, with micropapillary features. ER/PR positive. Clinical stage T3N0. Pathologic Stage: pT2, pN1a   Patient would be a good candidate for post-mastectomy radiation therapy along the right side. Given her advanced stage she would be at risk for loco-regional recurrence. I would recommend radiation treatment to the right chest wall, high axilla, and supraclavicular region. Patient has met with 3 plastic surgeons and has decided to proceed with tissue expanders followed by breast implants. She does not wish to consider extensive reconstruction. I will coordinate her radiation therapy with Dr. Crissie Reese.    Plan:  Patient will proceed with post-mastectomy radiation therapy along the right side once the tissue expander is in place and she is far enough out from  her most recent axillary node dissection. Anticipate 6-6-1/2 weeks of post mastectomy radiation therapy.  ____________________________________   Blair Promise, PhD, MD    This document serves as a record of services personally performed by Gery Pray, MD. It was created on his behalf by [facility], a trained medical scribe. The creation of this record is based on the scribe's personal observations and the provider's statements to them. This document has been checked and approved by the attending provider.

## 2018-01-07 ENCOUNTER — Telehealth: Payer: Self-pay

## 2018-01-07 NOTE — Telephone Encounter (Signed)
Left message for patient to call back regarding anti estrogen medication.

## 2018-01-07 NOTE — Telephone Encounter (Signed)
Per Dr. Geralyn Flash recommendations patient to resume anti estrogen medication while radiation is on hold for reconstruction surgery, and stop once radiation is re-started.   Nurse informed patient, patient voiced understanding.

## 2018-01-09 NOTE — Addendum Note (Signed)
Encounter addended by: Gery Pray, MD on: 01/09/2018 11:49 AM  Actions taken: Clinical Note Signed

## 2018-01-09 NOTE — Progress Notes (Signed)
Addendum:  Discussed management with Dr. Crissie Reese,  the patient's plastic surgeon as well as Dr. Nicholas Lose her medical oncologist. Anticipate delay in initiation of post- mastectomy radiation therapy given the need for placement of tissue expanders and subsequent expansion. In light of this delay the patient will restart her letrozole.  Attempted to discuss this with the patient but voice mailbox was full.  -----------------------------------  Blair Promise, PhD, MD

## 2018-01-14 ENCOUNTER — Ambulatory Visit: Payer: BLUE CROSS/BLUE SHIELD

## 2018-01-14 ENCOUNTER — Ambulatory Visit: Payer: BLUE CROSS/BLUE SHIELD | Admitting: Radiation Oncology

## 2018-03-03 ENCOUNTER — Other Ambulatory Visit: Payer: Self-pay

## 2018-03-03 DIAGNOSIS — C50411 Malignant neoplasm of upper-outer quadrant of right female breast: Secondary | ICD-10-CM

## 2018-03-03 DIAGNOSIS — Z170 Estrogen receptor positive status [ER+]: Secondary | ICD-10-CM

## 2018-03-03 NOTE — Progress Notes (Signed)
Pt calling with concerns of not having any follow up with Dr.Gudena since October 2019. Pt states that she decided not to forego with radiation at this time and would like to further discuss about her anti estrogen and how her body is responding to this. Pt also would like to see if she can get her lab work checked, along with her estrogen and vitamin d levels. Pt states that she had been trying to contact our office with no response. Apologized for pt inconvenience but will set her up with an appt right away with Dr.Gudena and lab work as requested. Will discuss with MD if there are any other need for additional lab work, aside from routine.

## 2018-03-04 ENCOUNTER — Other Ambulatory Visit: Payer: BLUE CROSS/BLUE SHIELD

## 2018-03-08 ENCOUNTER — Telehealth: Payer: Self-pay

## 2018-03-08 ENCOUNTER — Inpatient Hospital Stay: Payer: BLUE CROSS/BLUE SHIELD | Attending: Hematology and Oncology | Admitting: Hematology and Oncology

## 2018-03-08 DIAGNOSIS — Z9013 Acquired absence of bilateral breasts and nipples: Secondary | ICD-10-CM | POA: Diagnosis not present

## 2018-03-08 DIAGNOSIS — Z170 Estrogen receptor positive status [ER+]: Secondary | ICD-10-CM | POA: Diagnosis not present

## 2018-03-08 DIAGNOSIS — C50411 Malignant neoplasm of upper-outer quadrant of right female breast: Secondary | ICD-10-CM | POA: Diagnosis present

## 2018-03-08 DIAGNOSIS — C773 Secondary and unspecified malignant neoplasm of axilla and upper limb lymph nodes: Secondary | ICD-10-CM | POA: Diagnosis not present

## 2018-03-08 NOTE — Assessment & Plan Note (Signed)
10/13/2017:Bilateral mastectomies:  Right mastectomy: IDC grade 2, 4 cm, lymphovascular invasion is present, margins negative, 2/7 lymph nodes are positive, ER 60% moderate to week, PR 60% moderate to weak, HER-2 negative, Ki-67 5%, T2N1A stage IIa Left mastectomy: Benign radial scar  CT chest abdomen and pelvis 08/28/2017:Few scattered pulmonary nodules throughout noted throughout the lungs bilaterally 5 mm or less, likely benign follow-up studies recommended 09/24/2017 bone scan and thoracic and lumbar MRI: No evidence of metastatic disease  Mammaprint: Low risk luminal type A Based upon tumor board recommendation, we recommended that she undergo complete axillary lymph node dissection Axillary lymph node dissection 12/25/2017: 0/11 lymph nodes  Treatment plan: 1. adjuvant radiation 3. followed by adjuvant antiestrogen therapy will resume letrozole 2.5 mg daily for 7 years.  Patient took neoadjuvant letrozole therapy and tolerated it well.  Return to clinic in 3 months for follow-up for survivorship care plan visit

## 2018-03-08 NOTE — Telephone Encounter (Signed)
Yes

## 2018-03-08 NOTE — Telephone Encounter (Signed)
Please advise.  Copied from [place] [phone]. Topic: Appointment Scheduling - Scheduling Inquiry for Clinic >> Mar 08, 2018 12:06 PM Valla Leaver wrote: Reason for CRM: Ryleeann wants to know if Dr. Elease Hashimoto will see her as a new patient? He see's her husband Milta Croson and her two sins [employer] and [employer]. She has cholesterol concerns per her OB/GYN. 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

## 2018-03-08 NOTE — Progress Notes (Signed)
Patient Care Team: Sabrina Gully, NP as PCP - General (Obstetrics and Gynecology)  DIAGNOSIS:  Encounter Diagnosis  Name Primary?  . Malignant neoplasm of upper-outer quadrant of right breast in female, estrogen receptor positive (Sabrina Spencer)     SUMMARY OF ONCOLOGIC HISTORY:   Malignant neoplasm of upper-outer quadrant of right breast in female, estrogen receptor positive (Sabrina Spencer)   07/30/2017 Initial Diagnosis    Palpable nodules in the right breast: 10 o'clock position: U/S 4.1 cm mass, single abnormal lymph node: Biopsy benign, biopsy of the mass revealed IDC with micropapillary features and extravasated mucin, grade 2, EF 60%, PR 60%, Ki-67 5%, HER-2 negative ratio 1.32, MRI revealed additional satellite nodules together measured 7.5 cm, T3 N0 stage II clinical stage AJCC 8    10/13/2017 Surgery    Bilateral mastectomies: Right mastectomy: IDC grade 2, 4 cm, lymphovascular invasion is present, margins negative, 2/7 lymph nodes are positive, ER 60% moderate to week, PR 60% moderate to weak, HER-2 negative, Ki-67 5%, T2N1A stage IB Left mastectomy: Benign radial scar     10/20/2017 Cancer Staging    Staging form: Breast, AJCC 8th Edition - Pathologic: Stage IB (pT2, pN1, cM0, G2, ER+, PR+, HER2-) - Signed by Sabrina Lose, MD on 10/20/2017    10/21/2017 -  Anti-estrogen oral therapy    Letrozole 2.5 mg daily     CHIEF COMPLIANT: Follow-up to discuss her treatment plan, currently on letrozole  INTERVAL HISTORY: Sabrina Spencer is a 48-year with above-mentioned is a bilateral mastectomies with one positive lymph node out of 12.  She underwent breast reconstruction and is under the care of Dr. Harlow Spencer.  She has chosen not to undergo radiation.  She is here today to discuss the results of blood work done by her primary care physician as well as to discuss overall surveillance and treatment plan.  She reports no problems tolerating letrozole therapy.  Denies any hot flashes or  myalgias.  REVIEW OF SYSTEMS:   Constitutional: Denies fevers, chills or abnormal weight loss Eyes: Denies blurriness of vision Ears, nose, mouth, throat, and face: Denies mucositis or sore throat Respiratory: Denies cough, dyspnea or wheezes Cardiovascular: Denies palpitation, chest discomfort Gastrointestinal:  Denies nausea, heartburn or change in bowel habits Skin: Denies abnormal skin rashes Lymphatics: Denies new lymphadenopathy or easy bruising Neurological:Denies numbness, tingling or new weaknesses Behavioral/Psych: Mood is stable, no new changes  Extremities: No lower extremity edema Breast: Bilateral mastectomies with reconstruction All other systems were reviewed with the patient and are negative.  I have reviewed the past medical history, past surgical history, social history and family history with the patient and they are unchanged from previous note.  ALLERGIES:  has No Known Allergies.  MEDICATIONS:  Current Outpatient Medications  Medication Sig Dispense Refill  . Biotin 10000 MCG TABS Take 10,000 mcg by mouth daily.    . Cholecalciferol (VITAMIN D3) 5000 units CAPS Take 5,000 Units by mouth 2 (two) times a week.     . Cyanocobalamin 5000 MCG CAPS Take 5,000 mcg by mouth daily.     Marland Kitchen HYDROcodone-acetaminophen (NORCO) 5-325 MG tablet Take 1-2 tablets by mouth every 6 (six) hours as needed for moderate pain or severe pain. 20 tablet 0  . letrozole (FEMARA) 2.5 MG tablet Take 1 tablet (2.5 mg total) by mouth daily. (Patient taking differently: Take 2.5 mg by mouth at bedtime. ) 90 tablet 3   No current facility-administered medications for this visit.     PHYSICAL EXAMINATION: ECOG PERFORMANCE  STATUS: 1 - Symptomatic but completely ambulatory  Vitals:   03/08/18 1442  BP: 126/84  Pulse: 86  Resp: 20  Temp: 97.8 F (36.6 C)  SpO2: 100%   Filed Weights   03/08/18 1442  Weight: 168 lb 6.4 oz (76.4 kg)    GENERAL:alert, no distress and comfortable SKIN:  skin color, texture, turgor are normal, no rashes or significant lesions EYES: normal, Conjunctiva are pink and non-injected, sclera clear OROPHARYNX:no exudate, no erythema and lips, buccal mucosa, and tongue normal  NECK: supple, thyroid normal size, non-tender, without nodularity LYMPH:  no palpable lymphadenopathy in the cervical, axillary or inguinal LUNGS: clear to auscultation and percussion with normal breathing effort HEART: regular rate & rhythm and no murmurs and no lower extremity edema ABDOMEN:abdomen soft, non-tender and normal bowel sounds MUSCULOSKELETAL:no cyanosis of digits and no clubbing  NEURO: alert & oriented x 3 with fluent speech, no focal motor/sensory deficits EXTREMITIES: No lower extremity edema   LABORATORY DATA:  I have reviewed the data as listed CMP Latest Ref Rng & Units 12/24/2017 12/23/2017 12/14/2017  Glucose 70 - 99 mg/dL 113(H) - 104(H)  BUN 6 - 20 mg/dL 9 - 14  Creatinine 0.44 - 1.00 mg/dL 0.61 0.56 0.82  Sodium 135 - 145 mmol/L 139 - 138  Potassium 3.5 - 5.1 mmol/L 4.5 - 4.6  Chloride 98 - 111 mmol/L 106 - 104  CO2 22 - 32 mmol/L 27 - 24  Calcium 8.9 - 10.3 mg/dL 9.1 - 9.4  Total Protein 6.5 - 8.1 g/dL - - -  Total Bilirubin 0.3 - 1.2 mg/dL - - -  Alkaline Phos 38 - 126 U/L - - -  AST 15 - 41 U/L - - -  ALT 0 - 44 U/L - - -    Lab Results  Component Value Date   WBC 13.0 (H) 12/24/2017   HGB 12.6 12/24/2017   HCT 39.1 12/24/2017   MCV 99.7 12/24/2017   PLT 218 12/24/2017   NEUTROABS 2.4 10/06/2017    ASSESSMENT & PLAN:  Malignant neoplasm of upper-outer quadrant of right breast in female, estrogen receptor positive (Sabrina Spencer) 10/13/2017:Bilateral mastectomies:  Right mastectomy: IDC grade 2, 4 cm, lymphovascular invasion is present, margins negative, 2/7 lymph nodes are positive, ER 60% moderate to week, PR 60% moderate to weak, HER-2 negative, Ki-67 5%, T2N1A stage IIa Left mastectomy: Benign radial scar  CT chest abdomen and pelvis  08/28/2017:Few scattered pulmonary nodules throughout noted throughout the lungs bilaterally 5 mm or less, likely benign follow-up studies recommended 09/24/2017 bone scan and thoracic and lumbar MRI: No evidence of metastatic disease  Mammaprint: Low risk luminal type A Based upon tumor board recommendation, we recommended that she undergo complete axillary lymph node dissection Axillary lymph node dissection 12/25/2017: 0/11 lymph nodes  Treatment plan: 1. adjuvant radiation: Patient refused 3. followed by adjuvant antiestrogen therapy will resume letrozole 2.5 mg daily for 7 years.  Patient took neoadjuvant letrozole therapy and tolerated it well.  Return to clinic in 6 months for follow-up     No orders of the defined types were placed in this encounter.  The patient has a good understanding of the overall plan. she agrees with it. she will call with any problems that may develop before the next visit here.   Harriette Ohara, MD 03/08/18

## 2018-03-08 NOTE — Telephone Encounter (Signed)
Are you able to set this up?

## 2018-03-16 ENCOUNTER — Telehealth: Payer: Self-pay | Admitting: Hematology and Oncology

## 2018-03-16 NOTE — Telephone Encounter (Signed)
Pt approved to set up New Patient appt.  LMTCB

## 2018-03-16 NOTE — Telephone Encounter (Signed)
Patient called to reschedule due to insurance

## 2018-03-22 ENCOUNTER — Ambulatory Visit: Payer: BLUE CROSS/BLUE SHIELD | Admitting: Family Medicine

## 2018-03-24 ENCOUNTER — Telehealth: Payer: Self-pay | Admitting: Genetic Counselor

## 2018-03-24 NOTE — Telephone Encounter (Signed)
LM on VM that I needed to speak with her about her appointment next week.  Asked that she CB.

## 2018-03-31 ENCOUNTER — Other Ambulatory Visit: Payer: BLUE CROSS/BLUE SHIELD

## 2018-03-31 ENCOUNTER — Encounter: Payer: BLUE CROSS/BLUE SHIELD | Admitting: Genetic Counselor

## 2018-05-03 ENCOUNTER — Encounter: Payer: Self-pay | Admitting: *Deleted

## 2018-05-07 ENCOUNTER — Other Ambulatory Visit: Payer: Self-pay

## 2018-05-07 ENCOUNTER — Encounter (HOSPITAL_COMMUNITY): Payer: Self-pay

## 2018-05-07 ENCOUNTER — Ambulatory Visit: Payer: Self-pay | Admitting: Plastic Surgery

## 2018-05-07 MED ORDER — DEXTROSE 5 % IV SOLN: 900.0000 mg | INTRAVENOUS | Status: AC

## 2018-05-07 MED ORDER — GENTAMICIN SULFATE 40 MG/ML IJ SOLN: 5.0000 mg/kg | INTRAVENOUS | Status: AC

## 2018-05-07 NOTE — Progress Notes (Signed)
PCP - Denies  OB/GYN- Dr. Reuel Boom  Cardiologist - Denies  Chest x-ray - 07/31/17 (E)  EKG - 07/31/17 (E)  Stress Test - Denies  ECHO - Denies  Cardiac Cath - Denies  AICD-na   PM-na LOOP-na  Sleep Study - Denies CPAP - None  LABS- 05/10/2018: CBC, BMP, POC UPreg  ASA-Denies  Anesthesia- No  Pt denies having chest pain, sob, or fever during the pre-op phone call. Pt will be NPO after midnight, order confirmed with Barnett Applebaum from Dr. Alvira Philips office. All instructions explained to the pt, with a verbal understanding of the material, including: as of today, stop taking all Other Aspirin Products, Vitamins, Fish oils, and Herbal medications. Also stop all NSAIDS i.e. Advil, Ibuprofen, Motrin, Aleve, Anaprox, Naproxen, BC, Goody Powders, and all Supplements. Pt agrees to go over the instructions while at home for a better understanding. The opportunity to ask questions was provided.  Coronavirus Screening  Have you experienced the following symptoms:  Cough yes/no: No Fever (>100.37F)  yes/no: No Runny nose yes/no: No Sore throat yes/no: No Difficulty breathing/shortness of breath  yes/no: No  Have you or a family member traveled in the last 14 days and where? yes/no: No   If the patient indicates "YES" to the above questions, their PAT will be rescheduled to limit the exposure to others and, the surgeon will be notified. THE PATIENT WILL NEED TO BE ASYMPTOMATIC FOR 14 DAYS.   If the patient is not experiencing any of these symptoms, the PAT nurse will instruct them to NOT bring anyone with them to their appointment since they may have these symptoms or traveled as well.   Please remind your patients and families that hospital visitation restrictions are in effect and the importance of the restrictions.

## 2018-05-10 ENCOUNTER — Other Ambulatory Visit: Payer: Self-pay

## 2018-05-10 ENCOUNTER — Encounter (HOSPITAL_COMMUNITY): Payer: Self-pay | Admitting: General Practice

## 2018-05-10 ENCOUNTER — Ambulatory Visit (HOSPITAL_COMMUNITY)
Admission: RE | Admit: 2018-05-10 | Discharge: 2018-05-10 | Disposition: A | Discharge status: Home / Self Care | Payer: BLUE CROSS/BLUE SHIELD | Attending: Plastic Surgery | Admitting: Plastic Surgery

## 2018-05-10 ENCOUNTER — Encounter (HOSPITAL_COMMUNITY)
Admission: RE | Disposition: A | Discharge status: Home / Self Care | Payer: Self-pay | Source: Home / Self Care | Attending: Plastic Surgery

## 2018-05-10 ENCOUNTER — Ambulatory Visit (HOSPITAL_COMMUNITY): Payer: BLUE CROSS/BLUE SHIELD | Admitting: Anesthesiology

## 2018-05-10 DIAGNOSIS — F419 Anxiety disorder, unspecified: Secondary | ICD-10-CM | POA: Diagnosis not present

## 2018-05-10 DIAGNOSIS — Z853 Personal history of malignant neoplasm of breast: Secondary | ICD-10-CM | POA: Diagnosis present

## 2018-05-10 DIAGNOSIS — Z421 Encounter for breast reconstruction following mastectomy: Secondary | ICD-10-CM | POA: Insufficient documentation

## 2018-05-10 DIAGNOSIS — M1990 Unspecified osteoarthritis, unspecified site: Secondary | ICD-10-CM | POA: Diagnosis not present

## 2018-05-10 DIAGNOSIS — Z9013 Acquired absence of bilateral breasts and nipples: Secondary | ICD-10-CM | POA: Insufficient documentation

## 2018-05-10 HISTORY — PX: REMOVAL OF TISSUE EXPANDER AND PLACEMENT OF IMPLANT: SHX6457

## 2018-05-10 LAB — BASIC METABOLIC PANEL
Anion gap: 9 (ref 5–15)
BUN: 15 mg/dL (ref 6–20)
CO2: 24 mmol/L (ref 22–32)
Calcium: 9.3 mg/dL (ref 8.9–10.3)
Chloride: 105 mmol/L (ref 98–111)
Creatinine, Ser: 0.7 mg/dL (ref 0.44–1.00)
GFR calc Af Amer: >60 mL/min (ref >60)
GFR calc non Af Amer: >60 mL/min (ref >60)
Glucose, Bld: 96 mg/dL (ref 70–99)
Potassium: 3.9 mmol/L (ref 3.5–5.1)
Sodium: 138 mmol/L (ref 135–145)

## 2018-05-10 LAB — CBC
HCT: 40.3 % (ref 36.0–46.0)
Hemoglobin: 13.5 g/dL (ref 12.0–15.0)
MCH: 32.2 pg (ref 26.0–34.0)
MCHC: 33.5 g/dL (ref 30.0–36.0)
MCV: 96.2 fL (ref 80.0–100.0)
Platelets: 212 10*3/uL (ref 150–400)
RBC: 4.19 MIL/uL (ref 3.87–5.11)
RDW: 12.5 % (ref 11.5–15.5)
WBC: 4.9 10*3/uL (ref 4.0–10.5)
nRBC: 0 % (ref 0.0–0.2)

## 2018-05-10 LAB — POCT PREGNANCY, URINE: Preg Test, Ur: NEGATIVE

## 2018-05-10 SURGERY — REMOVAL, TISSUE EXPANDER, BREAST, WITH IMPLANT INSERTION
Anesthesia: General | Site: Chest | Laterality: Bilateral

## 2018-05-10 MED ORDER — LACTATED RINGERS IV SOLN
INTRAVENOUS | Status: DC
  Administered 2018-05-10 (×2): via INTRAVENOUS

## 2018-05-10 MED ORDER — FENTANYL CITRATE (PF) 100 MCG/2ML IJ SOLN
INTRAMUSCULAR | Status: DC | PRN
  Administered 2018-05-10: 50 ug via INTRAVENOUS
  Administered 2018-05-10: 100 ug via INTRAVENOUS
  Administered 2018-05-10 (×2): 50 ug via INTRAVENOUS

## 2018-05-10 MED ORDER — LACTATED RINGERS IV SOLN: INTRAVENOUS | Status: DC

## 2018-05-10 MED ORDER — ONDANSETRON HCL 4 MG/2ML IJ SOLN
INTRAMUSCULAR | Status: AC
  Filled 2018-05-10: qty 2

## 2018-05-10 MED ORDER — ACETAMINOPHEN 325 MG PO TABS: 325.0000 mg | ORAL_TABLET | Freq: Once | ORAL | Status: DC

## 2018-05-10 MED ORDER — HYDROMORPHONE HCL 1 MG/ML IJ SOLN
0.2500 mg | INTRAMUSCULAR | Status: DC | PRN
  Administered 2018-05-10 (×2): 0.25 mg via INTRAVENOUS

## 2018-05-10 MED ORDER — SCOPOLAMINE 1 MG/3DAYS TD PT72
MEDICATED_PATCH | TRANSDERMAL | Status: AC
  Filled 2018-05-10: qty 1

## 2018-05-10 MED ORDER — DEXAMETHASONE SODIUM PHOSPHATE 10 MG/ML IJ SOLN
INTRAMUSCULAR | Status: DC | PRN
  Administered 2018-05-10: 4 mg via INTRAVENOUS

## 2018-05-10 MED ORDER — FENTANYL CITRATE (PF) 250 MCG/5ML IJ SOLN
INTRAMUSCULAR | Status: AC
  Filled 2018-05-10: qty 5

## 2018-05-10 MED ORDER — DEXTROSE 5 % IV SOLN: 400.0000 mg | Freq: Once | INTRAVENOUS | Status: DC

## 2018-05-10 MED ORDER — LIDOCAINE 2% (20 MG/ML) 5 ML SYRINGE
INTRAMUSCULAR | Status: DC | PRN
  Administered 2018-05-10: 50 mg via INTRAVENOUS

## 2018-05-10 MED ORDER — SCOPOLAMINE 1 MG/3DAYS TD PT72
MEDICATED_PATCH | TRANSDERMAL | Status: DC | PRN
  Administered 2018-05-10: 1 via TRANSDERMAL

## 2018-05-10 MED ORDER — CLINDAMYCIN PHOSPHATE 600 MG/50ML IV SOLN
600.0000 mg | INTRAVENOUS | Status: AC
  Administered 2018-05-10: 600 mg via INTRAVENOUS
  Filled 2018-05-10: qty 50

## 2018-05-10 MED ORDER — ROCURONIUM BROMIDE 50 MG/5ML IV SOSY
PREFILLED_SYRINGE | INTRAVENOUS | Status: DC | PRN
  Administered 2018-05-10: 50 mg via INTRAVENOUS
  Administered 2018-05-10: 20 mg via INTRAVENOUS
  Administered 2018-05-10 (×2): 10 mg via INTRAVENOUS

## 2018-05-10 MED ORDER — SODIUM CHLORIDE 0.9 % IV SOLN
Freq: Once | INTRAVENOUS | Status: DC
  Filled 2018-05-10 (×2): qty 500000

## 2018-05-10 MED ORDER — SODIUM CHLORIDE 0.9 % IV SOLN
INTRAVENOUS | Status: DC | PRN
  Administered 2018-05-10 (×2): 500 mL

## 2018-05-10 MED ORDER — HYDROMORPHONE HCL 1 MG/ML IJ SOLN
INTRAMUSCULAR | Status: AC
  Filled 2018-05-10: qty 1

## 2018-05-10 MED ORDER — MEPERIDINE HCL 25 MG/ML IJ SOLN: 6.2500 mg | INTRAMUSCULAR | Status: DC | PRN

## 2018-05-10 MED ORDER — SODIUM CHLORIDE (PF) 0.9 % IJ SOLN
INTRAMUSCULAR | Status: DC | PRN
  Administered 2018-05-10 (×2): 750 mL

## 2018-05-10 MED ORDER — HEPARIN SODIUM (PORCINE) 5000 UNIT/ML IJ SOLN
5000.0000 [IU] | Freq: Once | INTRAMUSCULAR | Status: AC
  Administered 2018-05-10: 12:00:00 5000 [IU] via SUBCUTANEOUS

## 2018-05-10 MED ORDER — PROMETHAZINE HCL 25 MG/ML IJ SOLN: 6.2500 mg | INTRAMUSCULAR | Status: DC | PRN

## 2018-05-10 MED ORDER — ACETAMINOPHEN 160 MG/5ML PO SOLN: 325.0000 mg | Freq: Once | ORAL | Status: DC

## 2018-05-10 MED ORDER — ONDANSETRON HCL 4 MG/2ML IJ SOLN
INTRAMUSCULAR | Status: DC | PRN
  Administered 2018-05-10: 4 mg via INTRAVENOUS

## 2018-05-10 MED ORDER — CHLORHEXIDINE GLUCONATE CLOTH 2 % EX PADS: 6.0000 | MEDICATED_PAD | Freq: Once | CUTANEOUS | Status: DC

## 2018-05-10 MED ORDER — 0.9 % SODIUM CHLORIDE (POUR BTL) OPTIME
TOPICAL | Status: DC | PRN
  Administered 2018-05-10 (×2): 1000 mL

## 2018-05-10 MED ORDER — MIDAZOLAM HCL 2 MG/2ML IJ SOLN
INTRAMUSCULAR | Status: AC
  Filled 2018-05-10: qty 2

## 2018-05-10 MED ORDER — HEPARIN SODIUM (PORCINE) 5000 UNIT/ML IJ SOLN
INTRAMUSCULAR | Status: AC
  Administered 2018-05-10: 5000 [IU] via SUBCUTANEOUS
  Filled 2018-05-10: qty 1

## 2018-05-10 MED ORDER — PROPOFOL 10 MG/ML IV BOLUS
INTRAVENOUS | Status: DC | PRN
  Administered 2018-05-10: 140 mg via INTRAVENOUS

## 2018-05-10 MED ORDER — SUGAMMADEX SODIUM 200 MG/2ML IV SOLN
INTRAVENOUS | Status: DC | PRN
  Administered 2018-05-10: 200 mg via INTRAVENOUS

## 2018-05-10 MED ORDER — MIDAZOLAM HCL 5 MG/5ML IJ SOLN
INTRAMUSCULAR | Status: DC | PRN
  Administered 2018-05-10: 2 mg via INTRAVENOUS

## 2018-05-10 MED ORDER — ACETAMINOPHEN 10 MG/ML IV SOLN: 1000.0000 mg | Freq: Once | INTRAVENOUS | Status: DC | PRN

## 2018-05-10 MED ORDER — EPHEDRINE SULFATE-NACL 50-0.9 MG/10ML-% IV SOSY
PREFILLED_SYRINGE | INTRAVENOUS | Status: DC | PRN
  Administered 2018-05-10: 5 mg via INTRAVENOUS

## 2018-05-10 MED ORDER — SUCCINYLCHOLINE CHLORIDE 20 MG/ML IJ SOLN
INTRAMUSCULAR | Status: DC | PRN
  Administered 2018-05-10: 120 mg via INTRAVENOUS

## 2018-05-10 SURGICAL SUPPLY — 55 items
ATCH SMKEVC FLXB CAUT HNDSWH (FILTER) ×1 IMPLANT
BAG DECANTER FOR FLEXI CONT (MISCELLANEOUS) IMPLANT
BANDAGE ACE 6X5 VEL STRL LF (GAUZE/BANDAGES/DRESSINGS) ×2 IMPLANT
BINDER BREAST LRG (GAUZE/BANDAGES/DRESSINGS) ×2 IMPLANT
BIOPATCH RED 1 DISK 7.0 (GAUZE/BANDAGES/DRESSINGS) ×4 IMPLANT
CANISTER SUCT 3000ML PPV (MISCELLANEOUS) ×2 IMPLANT
CHLORAPREP W/TINT 26ML (MISCELLANEOUS) ×4 IMPLANT
COVER SURGICAL LIGHT HANDLE (MISCELLANEOUS) ×2 IMPLANT
COVER WAND RF STERILE (DRAPES) ×2 IMPLANT
DERMABOND ADHESIVE PROPEN (GAUZE/BANDAGES/DRESSINGS) ×1
DERMABOND ADVANCED (GAUZE/BANDAGES/DRESSINGS) ×1
DERMABOND ADVANCED .7 DNX12 (GAUZE/BANDAGES/DRESSINGS) ×1 IMPLANT
DERMABOND ADVANCED .7 DNX6 (GAUZE/BANDAGES/DRESSINGS) ×1 IMPLANT
DRAIN CHANNEL 19F RND (DRAIN) ×2 IMPLANT
DRAPE HALF SHEET 40X57 (DRAPES) IMPLANT
DRAPE ORTHO SPLIT 77X108 STRL (DRAPES) ×2
DRAPE SURG 17X23 STRL (DRAPES) ×4 IMPLANT
DRAPE SURG ORHT 6 SPLT 77X108 (DRAPES) ×2 IMPLANT
DRAPE WARM FLUID 44X44 (DRAPE) ×2 IMPLANT
DRSG PAD ABDOMINAL 8X10 ST (GAUZE/BANDAGES/DRESSINGS) ×2 IMPLANT
DRSG SORBAVIEW 3.5X5-5/16 MED (GAUZE/BANDAGES/DRESSINGS) ×4 IMPLANT
ELECT CAUTERY BLADE 6.4 (BLADE) ×2 IMPLANT
ELECT REM PT RETURN 9FT ADLT (ELECTROSURGICAL) ×2
ELECTRODE REM PT RTRN 9FT ADLT (ELECTROSURGICAL) ×1 IMPLANT
EVACUATOR SILICONE 100CC (DRAIN) ×2 IMPLANT
EVACUATOR SMOKE ACCUVAC VALLEY (FILTER) ×1
GLOVE BIO SURGEON STRL SZ7.5 (GLOVE) ×4 IMPLANT
GLOVE BIOGEL PI IND STRL 8 (GLOVE) ×1 IMPLANT
GLOVE BIOGEL PI INDICATOR 8 (GLOVE) ×1
GOWN STRL REUS W/ TWL LRG LVL3 (GOWN DISPOSABLE) ×1 IMPLANT
GOWN STRL REUS W/ TWL XL LVL3 (GOWN DISPOSABLE) ×1 IMPLANT
GOWN STRL REUS W/TWL LRG LVL3 (GOWN DISPOSABLE) ×1
GOWN STRL REUS W/TWL XL LVL3 (GOWN DISPOSABLE) ×1
IMPL BREAST HP 630CC (Breast) ×2 IMPLANT
IMPLANT BREAST HP 630CC (Breast) ×4 IMPLANT
KIT BASIN OR (CUSTOM PROCEDURE TRAY) ×2 IMPLANT
KIT TURNOVER KIT B (KITS) ×2 IMPLANT
MARKER SKIN DUAL TIP RULER LAB (MISCELLANEOUS) ×2 IMPLANT
NS IRRIG 1000ML POUR BTL (IV SOLUTION) ×4 IMPLANT
PACK GENERAL/GYN (CUSTOM PROCEDURE TRAY) ×2 IMPLANT
PAD ABD 8X10 STRL (GAUZE/BANDAGES/DRESSINGS) ×4 IMPLANT
PAD ARMBOARD 7.5X6 YLW CONV (MISCELLANEOUS) ×2 IMPLANT
PREFILTER EVAC NS 1 1/3-3/8IN (MISCELLANEOUS) ×2 IMPLANT
SLEEVE SUCTION CATH 165 (SLEEVE) ×2 IMPLANT
SUT ETHILON 2 0 FS 18 (SUTURE) ×2 IMPLANT
SUT MNCRL AB 3-0 PS2 18 (SUTURE) ×6 IMPLANT
SUT MNCRL AB 4-0 PS2 18 (SUTURE) ×4 IMPLANT
SUT PDS 2 0 CTB 1 36 (SUTURE) IMPLANT
SUT PROLENE 3 0 PS 2 (SUTURE) ×2 IMPLANT
SUT VIC AB 3-0 SH 18 (SUTURE) ×2 IMPLANT
SYR BULB IRRIGATION 50ML (SYRINGE) ×2 IMPLANT
TOWEL OR 17X24 6PK STRL BLUE (TOWEL DISPOSABLE) ×2 IMPLANT
TOWEL OR 17X26 10 PK STRL BLUE (TOWEL DISPOSABLE) ×2 IMPLANT
TUBE CONNECTING 12X1/4 (SUCTIONS) ×2 IMPLANT
TUBING SET GRADUATE ASPIR 12FT (MISCELLANEOUS) ×2 IMPLANT

## 2018-05-10 NOTE — Anesthesia Preprocedure Evaluation (Addendum)
Anesthesia Evaluation  Patient identified by MRN, date of birth, ID band Patient awake    Reviewed: Allergy & Precautions, NPO status , Patient's Chart, lab work & pertinent test results  History of Anesthesia Complications (+) PONV  Airway Mallampati: II  TM Distance: >3 FB Neck ROM: Full    Dental  (+) Teeth Intact, Dental Advisory Given   Pulmonary neg pulmonary ROS,    breath sounds clear to auscultation       Cardiovascular negative cardio ROS   Rhythm:Regular Rate:Normal     Neuro/Psych negative neurological ROS     GI/Hepatic negative GI ROS, Neg liver ROS,   Endo/Other  negative endocrine ROS  Renal/GU negative Renal ROS     Musculoskeletal  (+) Arthritis ,   Abdominal Normal abdominal exam  (+)   Peds  Hematology   Anesthesia Other Findings   Reproductive/Obstetrics                            Lab Results  Component Value Date   WBC 4.9 05/10/2018   HGB 13.5 05/10/2018   HCT 40.3 05/10/2018   MCV 96.2 05/10/2018   PLT 212 05/10/2018   Lab Results  Component Value Date   CREATININE 0.61 12/24/2017   BUN 9 12/24/2017   NA 139 12/24/2017   K 4.5 12/24/2017   CL 106 12/24/2017   CO2 27 12/24/2017   No results found for: INR, PROTIME  EKG: normal sinus rhythm.  Anesthesia Physical Anesthesia Plan  ASA: II  Anesthesia Plan: General   Post-op Pain Management:    Induction: Intravenous  PONV Risk Score and Plan: 4 or greater and Dexamethasone, Midazolam, Scopolamine patch - Pre-op and Ondansetron  Airway Management Planned: Oral ETT  Additional Equipment: None  Intra-op Plan:   Post-operative Plan: Extubation in OR  Informed Consent: I have reviewed the patients History and Physical, chart, labs and discussed the procedure including the risks, benefits and alternatives for the proposed anesthesia with the patient or authorized representative who has  indicated his/her understanding and acceptance.     Dental advisory given  Plan Discussed with: CRNA  Anesthesia Plan Comments:        Anesthesia Quick Evaluation

## 2018-05-10 NOTE — H&P (Signed)
I have re-examined and re-evaluated the patient and there are no changes.  See office notes in paper chart H & P.

## 2018-05-10 NOTE — Brief Op Note (Signed)
05/10/2018  5:43 PM  PATIENT:  Sabrina Spencer  48 y.o. female  PRE-OPERATIVE DIAGNOSIS:  HISTORY OF BREAST CANCER  POST-OPERATIVE DIAGNOSIS:  HISTORY OF BREAST CANCER  PROCEDURE:  Procedure(s): BILATERAL REMOVAL OF TISSUE EXPANDER AND PLACEMENT OF IMPLANTS AND POSSIBLE FAT GRAFTING (Bilateral)  SURGEON:  Surgeon(s) and Role:    Crissie Reese, MD - Primary  PHYSICIAN ASSISTANT:   ASSISTANTS: Peyton, RNFA   ANESTHESIA:   general  EBL:  50 mL   BLOOD ADMINISTERED:none  DRAINS: (One) Jackson-Pratt drain(s) with closed bulb suction in the right and left chest   LOCAL MEDICATIONS USED:  NONE  SPECIMEN:  No Specimen  DISPOSITION OF SPECIMEN:  N/A  COUNTS:  YES  TOURNIQUET:  * No tourniquets in log *  DICTATION: .Other Dictation: Dictation Number [id number]  PLAN OF CARE: Discharge to home after PACU  PATIENT DISPOSITION:  PACU - hemodynamically stable.   Delay start of Pharmacological VTE agent (>24hrs) due to surgical blood loss or risk of bleeding: not applicable

## 2018-05-10 NOTE — Transfer of Care (Signed)
Immediate Anesthesia Transfer of Care Note  Patient: Sabrina Spencer  Procedure(s) Performed: BILATERAL REMOVAL OF TISSUE EXPANDER AND PLACEMENT OF IMPLANTS AND POSSIBLE FAT GRAFTING (Bilateral Chest)  Patient Location: PACU  Anesthesia Type:General  Level of Consciousness: awake, alert  and oriented  Airway & Oxygen Therapy: Patient Spontanous Breathing  Post-op Assessment: Report given to RN and Post -op Vital signs reviewed and stable  Post vital signs: Reviewed and stable  Last Vitals:  Vitals Value Taken Time  BP 119/87 05/10/2018  5:37 PM  Temp    Pulse 98 05/10/2018  5:39 PM  Resp 17 05/10/2018  5:39 PM  SpO2 96 % 05/10/2018  5:39 PM  Vitals shown include unvalidated device data.  Last Pain:  Vitals:   05/10/18 1152  TempSrc:   PainSc: 2       Patients Stated Pain Goal: 2 (29/24/46 2863)  Complications: No apparent anesthesia complications

## 2018-05-10 NOTE — Discharge Instructions (Addendum)
No lifting for 6 weeks No vigorous activity for 6 weeks (including outdoor walks) No raising arms overhead No driving for 4 weeks OK to walk up stairs slowly Stay propped up Use incentive spirometer at home every hour while awake No shower while drains are in place Empty drains at least three times a day and record the amounts separately Take an over-the-counter Probiotic while on antibiotics Take an over-the-counter stool softener (such as Colace) while on pain medication Call [phone] for appointment with Dr. Harlow Mares in the office next week

## 2018-05-10 NOTE — Anesthesia Procedure Notes (Signed)
Procedure Name: Intubation Date/Time: 05/10/2018 2:06 PM Performed by: Leonor Liv, CRNA Pre-anesthesia Checklist: Patient identified, Emergency Drugs available, Suction available and Patient being monitored Patient Re-evaluated:Patient Re-evaluated prior to induction Oxygen Delivery Method: Circle System Utilized Preoxygenation: Pre-oxygenation with 100% oxygen Induction Type: IV induction Ventilation: Mask ventilation without difficulty Laryngoscope Size: Mac and 3 Grade View: Grade II Tube type: Oral Tube size: 7.0 mm Number of attempts: 1 Airway Equipment and Method: Stylet and Oral airway Placement Confirmation: ETT inserted through vocal cords under direct vision,  positive ETCO2 and breath sounds checked- equal and bilateral Secured at: 21 cm Tube secured with: Tape Dental Injury: Teeth and Oropharynx as per pre-operative assessment

## 2018-05-11 ENCOUNTER — Encounter (HOSPITAL_COMMUNITY): Payer: Self-pay | Admitting: Plastic Surgery

## 2018-05-11 NOTE — Op Note (Signed)
NAME: Sabrina Spencer, Sabrina Spencer MEDICAL RECORD OI:[id number] ACCOUNT [id number] DATE OF BIRTH:1970/12/04 FACILITY: [facility] LOCATION: [facility] PHYSICIAN:Keidy Thurgood Ann Held, MD  OPERATIVE REPORT  DATE OF PROCEDURE:  05/10/2018  PREOPERATIVE DIAGNOSIS:  Breast cancer.  POSTOPERATIVE DIAGNOSIS:  Breast cancer.  PROCEDURE PERFORMED:  Bilateral breast reconstruction using other technique.  SURGEON:  Youlanda Roys. Harlow Mares, MD  ANESTHESIA:  General.  ASSISTANTRia Clock, RNFA.  DRAINS:  There was a 19-French drain left on each side.  CLINICAL NOTE:  This 48 year old woman has had breast cancer and has had bilateral mastectomy.  She had tissue expanders placed as a delayed procedure after mastectomy.  She now presents for her breast reconstruction.  The plan is to do a combination of  procedures for the reconstruction.  These procedures included removal of tissue expanders and placement of implants, lateral chest liposuction bilateral, and lateral mastectomy scar revision.  The nature of the procedure, the risks, the possible  complications were discussed with her in great detail.  She understood the risks included but were not limited to bleeding, infection, healing problems, scarring, loss of sensation, fluid accumulations, contour deformities, anesthesia complications, DVT,  PE, pneumothorax, failure of device, capsular contracture, displacement device, wrinkles, asymmetry, chronic pain, and overall disappointment.  She understood all this and wished to proceed.  She selected a 750 mL saline implants rather than silicone  gel.  She also was aware that on the right side where she had had an episode of erythema in the past, that it could be that that site would be more prone to have a postoperative infection.  Having understood all this and all of her questions have been  answered, she wished to proceed.  DESCRIPTION OF PROCEDURE:  The patient was marked in a full upright position in the holding area.  She  was taken to the operating room and placed supine.  After successful induction of general anesthesia, she was prepped with ChloraPrep and after waiting  3 minutes for the drain. she was draped with sterile drapes.  Inframammary crease approach was used  bilateral and the dissection carried down through the subcutaneous tissue.  Left side was performed first and right side  afterwards.  The underlying  muscle splitting incision was then used to access the capsules.  Capsules were opened and the tissue expanders deflated and removed.  The spaces were inspected first on the left side, which was performed first and later on the right side.  Both sides  looked good.  No evidence of any infection.  There was no evidence of any extraneous fluid or seroma.  After thorough irrigation with saline and meticulous with electrocautery, the implants were prepared.  These were Mentor high profile 750 mL saline  implants.  These were soaked in antibiotic solution.  A closed filling system was used to place 100 mL of sterile saline on each side.  The 34 French drains were positioned bilateral and brought out through separate stab wounds inferolateral.  An  inferomedial capsulotomy was performed on the left side and a superior capsulotomy performed on the right side.  Again, irrigation was performed and hemostasis was achieved using electrocautery.  Antibiotic solution was placed in the spaces and allowed  to dwell  there.  Again, final check was made for hemostasis.  The implants were positioned.  They were filled with a maximum 750 mL using the closed filling system.  Sterile saline was used for the fill.  Care was taken to make certain of positioning  and the  wounds were then irrigated with antibiotic solution and the closures were then performed with 3-0 Vicryl interrupted sutures followed by 3-0 Monocryl interrupted inverted deep dermal sutures followed by 4-0 Monocryl running subcuticular suture.  Attention was then  directed to the lateral chest on each side.  A 15 scalpel was used to make a small incision at the lateral aspect of the old mastectomy scar and the liposuction was performed using #4 cannula.  This was power-assisted liposuction. The  liposuction cannula was directed away from the implants on each side at all times.  This seemed to give a much improved contour to the lateral chest wall.  The scar revisions were performed at the lateral aspect of the mastectomy scars, excising the  excess skin and then thorough irrigation with antibiotic solution.  A layered closure was performed using 3-0 Monocryl interrupted inverted deep dermal sutures followed by running 4-0 Monocryl subcuticular suture.  Dermabond and dry sterile dressings  were applied.  Biopatch and  SorbaView dressings were applied around the drains.  A folded ABDs were used to apply pressure to the liposuction sites and she was wrapped with an Ace wrap and then the breast vest also was positioned over this as well.  She  was transported to the recovery room stable, having tolerated the procedure well.  DISPOSITION:  She will follow up in the office next week.  AN/NUANCE  D:05/10/2018 T:05/11/2018 JOB:[id number]

## 2018-05-13 NOTE — Anesthesia Postprocedure Evaluation (Signed)
Anesthesia Post Note  Patient: Sabrina Spencer  Procedure(s) Performed: BILATERAL REMOVAL OF TISSUE EXPANDER AND PLACEMENT OF IMPLANTS AND POSSIBLE FAT GRAFTING (Bilateral Chest)     Patient location during evaluation: PACU Anesthesia Type: General Level of consciousness: awake and alert Pain management: pain level controlled Vital Signs Assessment: post-procedure vital signs reviewed and stable Respiratory status: spontaneous breathing, nonlabored ventilation, respiratory function stable and patient connected to nasal cannula oxygen Cardiovascular status: blood pressure returned to baseline and stable Postop Assessment: no apparent nausea or vomiting Anesthetic complications: no    Last Vitals:  Vitals:   05/10/18 1821 05/10/18 1836  BP: 108/76 113/85  Pulse: 78 85  Resp: 11 13  Temp:  36.5 C  SpO2: 99% 100%    Last Pain:  Vitals:   05/10/18 1836  TempSrc:   PainSc: 4                  Anikin Prosser S

## 2018-07-31 ENCOUNTER — Other Ambulatory Visit: Payer: Self-pay | Admitting: Hematology and Oncology

## 2018-08-02 ENCOUNTER — Encounter: Payer: Self-pay | Admitting: Hematology and Oncology

## 2018-08-23 ENCOUNTER — Telehealth: Payer: Self-pay | Admitting: Genetic Counselor

## 2018-08-23 NOTE — Telephone Encounter (Signed)
Called patient regarding upcoming Webex appointment, left a voicemail. This will be considered a walk-in visit due to no communication to set this up as virtual. °

## 2018-08-24 ENCOUNTER — Inpatient Hospital Stay: Payer: BC Managed Care – PPO | Attending: Hematology and Oncology | Admitting: Genetic Counselor

## 2018-08-24 ENCOUNTER — Telehealth: Payer: Self-pay | Admitting: Genetic Counselor

## 2018-08-24 ENCOUNTER — Inpatient Hospital Stay: Payer: BC Managed Care – PPO

## 2018-08-24 ENCOUNTER — Other Ambulatory Visit: Payer: Self-pay

## 2018-08-24 ENCOUNTER — Encounter: Payer: Self-pay | Admitting: Genetic Counselor

## 2018-08-24 DIAGNOSIS — Z170 Estrogen receptor positive status [ER+]: Secondary | ICD-10-CM

## 2018-08-24 DIAGNOSIS — M545 Low back pain: Secondary | ICD-10-CM | POA: Insufficient documentation

## 2018-08-24 DIAGNOSIS — M25562 Pain in left knee: Secondary | ICD-10-CM | POA: Insufficient documentation

## 2018-08-24 DIAGNOSIS — M25512 Pain in left shoulder: Secondary | ICD-10-CM | POA: Insufficient documentation

## 2018-08-24 DIAGNOSIS — Z801 Family history of malignant neoplasm of trachea, bronchus and lung: Secondary | ICD-10-CM

## 2018-08-24 DIAGNOSIS — C50411 Malignant neoplasm of upper-outer quadrant of right female breast: Secondary | ICD-10-CM

## 2018-08-24 DIAGNOSIS — Z808 Family history of malignant neoplasm of other organs or systems: Secondary | ICD-10-CM | POA: Diagnosis not present

## 2018-08-24 DIAGNOSIS — M25561 Pain in right knee: Secondary | ICD-10-CM | POA: Insufficient documentation

## 2018-08-24 DIAGNOSIS — Z8052 Family history of malignant neoplasm of bladder: Secondary | ICD-10-CM

## 2018-08-24 DIAGNOSIS — M25511 Pain in right shoulder: Secondary | ICD-10-CM | POA: Diagnosis not present

## 2018-08-24 DIAGNOSIS — Z315 Encounter for genetic counseling: Secondary | ICD-10-CM

## 2018-08-24 LAB — CBC WITH DIFFERENTIAL (CANCER CENTER ONLY)
Abs Immature Granulocytes: 0.04 10*3/uL (ref 0.00–0.07)
Basophils Absolute: 0.1 10*3/uL (ref 0.0–0.1)
Basophils Relative: 1 %
Eosinophils Absolute: 0.2 10*3/uL (ref 0.0–0.5)
Eosinophils Relative: 4 %
HCT: 36.7 % (ref 36.0–46.0)
Hemoglobin: 12.3 g/dL (ref 12.0–15.0)
Immature Granulocytes: 1 %
Lymphocytes Relative: 28 %
Lymphs Abs: 1.4 10*3/uL (ref 0.7–4.0)
MCH: 33 pg (ref 26.0–34.0)
MCHC: 33.5 g/dL (ref 30.0–36.0)
MCV: 98.4 fL (ref 80.0–100.0)
Monocytes Absolute: 0.7 10*3/uL (ref 0.1–1.0)
Monocytes Relative: 14 %
Neutro Abs: 2.6 10*3/uL (ref 1.7–7.7)
Neutrophils Relative %: 52 %
Platelet Count: 259 10*3/uL (ref 150–400)
RBC: 3.73 MIL/uL — ABNORMAL LOW (ref 3.87–5.11)
RDW: 12.5 % (ref 11.5–15.5)
WBC Count: 4.9 10*3/uL (ref 4.0–10.5)
nRBC: 0 % (ref 0.0–0.2)

## 2018-08-24 LAB — CMP (CANCER CENTER ONLY)
ALT: 14 U/L (ref 0–44)
AST: 17 U/L (ref 15–41)
Albumin: 4 g/dL (ref 3.5–5.0)
Alkaline Phosphatase: 62 U/L (ref 38–126)
Anion gap: 8 (ref 5–15)
BUN: 14 mg/dL (ref 6–20)
CO2: 26 mmol/L (ref 22–32)
Calcium: 9.9 mg/dL (ref 8.9–10.3)
Chloride: 107 mmol/L (ref 98–111)
Creatinine: 0.72 mg/dL (ref 0.44–1.00)
GFR, Est AFR Am: >60 mL/min (ref >60)
GFR, Estimated: >60 mL/min (ref >60)
Glucose, Bld: 101 mg/dL — ABNORMAL HIGH (ref 70–99)
Potassium: 5.5 mmol/L — ABNORMAL HIGH (ref 3.5–5.1)
Sodium: 141 mmol/L (ref 135–145)
Total Bilirubin: 0.4 mg/dL (ref 0.3–1.2)
Total Protein: 7.5 g/dL (ref 6.5–8.1)

## 2018-08-24 NOTE — Progress Notes (Signed)
REFERRING PROVIDER: Nicholas Lose, MD [address] [place],  [place] [address]  PRIMARY PROVIDER:  Avon Gully, NP  PRIMARY REASON FOR VISIT:  1. Malignant neoplasm of upper-outer quadrant of right breast in female, estrogen receptor positive ([place])   2. Family history of lung cancer   3. Family history of thyroid cancer   4. Family history of bladder cancer   5. Family history of brain cancer      HISTORY OF PRESENT ILLNESS:   Sabrina Spencer, a 48 y.o. female, was seen for a [facility] cancer genetics consultation at the request of Dr. Lindi Adie due to a personal history of breast cancer and a family history of lung, thyroid, bladder, and brain cancer.  Sabrina Spencer presents to clinic today to discuss the possibility of a hereditary predisposition to cancer, genetic testing, and to further clarify her future cancer risks, as well as potential cancer risks for family members.   In 2019, at the age of 54, Sabrina Spencer was diagnosed with IDC, ER+/PR+/Her2-, of the right breast. The treatment plan included bilateral mastectomy and antiestrogen therapy.    CANCER HISTORY:  Oncology History  Malignant neoplasm of upper-outer quadrant of right breast in female, estrogen receptor positive ([place])  07/30/2017 Initial Diagnosis   Palpable nodules in the right breast: 10 o'clock position: U/S 4.1 cm mass, single abnormal lymph node: Biopsy benign, biopsy of the mass revealed IDC with micropapillary features and extravasated mucin, grade 2, EF 60%, PR 60%, Ki-67 5%, HER-2 negative ratio 1.32, MRI revealed additional satellite nodules together measured 7.5 cm, T3 N0 stage II clinical stage AJCC 8   10/13/2017 Surgery   Bilateral mastectomies: Right mastectomy: IDC grade 2, 4 cm, lymphovascular invasion is present, margins negative, 2/7 lymph nodes are positive, ER 60% moderate to week, PR 60% moderate to weak, HER-2 negative, Ki-67 5%, T2N1A stage IB Left mastectomy: Benign radial scar     10/20/2017 Cancer Staging   Staging form: Breast, AJCC 8th Edition - Pathologic: Stage IB (pT2, pN1, cM0, G2, ER+, PR+, HER2-) - Signed by Nicholas Lose, MD on 10/20/2017   10/21/2017 -  Anti-estrogen oral therapy   Letrozole 2.5 mg daily      RISK FACTORS:  Menarche was at age 11.  First live birth at age 35.  OCP use for approximately 5 years.  Ovaries intact: yes.  Hysterectomy: no.  Menopausal status: perimenopausal.  HRT use: 7 months. Colonoscopy: no Mammogram within the last year: yes. Number of breast biopsies: 1.   Past Medical History:  Diagnosis Date  . Anemia    has low iron  . Arthritis   . Breast mass   . Cancer [facility])    Right breast  . Degeneration of L4-L5 intervertebral disc 10/05/2017  . Family history of bladder cancer   . Family history of brain cancer   . Family history of lung cancer   . Family history of thyroid cancer   . PONV (postoperative nausea and vomiting)    post op N&V    Past Surgical History:  Procedure Laterality Date  . BREAST SURGERY     biopsy at [facility]  . LYMPH NODE DISSECTION Right 12/23/2017   Procedure: RIGHT AXILLARY LYMPH NODE DISSECTION ERAS PATHWAY;  Surgeon: Fanny Skates, MD;  Location: [facility];  Service: General;  Laterality: Right;  . MASTECTOMY W/ SENTINEL NODE BIOPSY Bilateral 10/13/2017  . MASTECTOMY W/ SENTINEL NODE BIOPSY Bilateral 10/13/2017   Procedure: BILATERAL MASTECTOMIES WITH RIGHT SENTINEL LYMPH NODE  BIOPSY;  Surgeon: Fanny Skates, MD;  Location: [facility];  Service: General;  Laterality: Bilateral;  . REMOVAL OF TISSUE EXPANDER AND PLACEMENT OF IMPLANT Bilateral 05/10/2018   Procedure: BILATERAL REMOVAL OF TISSUE EXPANDER AND PLACEMENT OF IMPLANTS AND POSSIBLE FAT GRAFTING;  Surgeon: Crissie Reese, MD;  Location: [facility];  Service: Plastics;  Laterality: Bilateral;  . TONSILLECTOMY    . uterine ablation      Social History   Socioeconomic History  . Marital status: Married    Spouse name:  Not on file  . Number of children: Not on file  . Years of education: Not on file  . Highest education level: Not on file  Occupational History  . Not on file  Social Needs  . Financial resource strain: Not on file  . Food insecurity    Worry: Not on file    Inability: Not on file  . Transportation needs    Medical: Not on file    Non-medical: Not on file  Tobacco Use  . Smoking status: Never Smoker  . Smokeless tobacco: Never Used  Substance and Sexual Activity  . Alcohol use: Yes    Alcohol/week: 0.0 standard drinks    Comment: 4drinks/week  . Drug use: No  . Sexual activity: Yes    Birth control/protection: None  Lifestyle  . Physical activity    Days per week: Not on file    Minutes per session: Not on file  . Stress: Not on file  Relationships  . Social Herbalist on phone: Not on file    Gets together: Not on file    Attends religious service: Not on file    Active member of club or organization: Not on file    Attends meetings of clubs or organizations: Not on file    Relationship status: Not on file  Other Topics Concern  . Not on file  Social History Narrative  . Not on file     FAMILY HISTORY:  We obtained a detailed, 4-generation family history.  Significant diagnoses are listed below: Family History  Problem Relation Age of Onset  . Lung cancer Maternal Grandfather        diagnosed 74s  . Brain cancer Maternal Uncle 57  . Bladder Cancer Maternal Aunt        diagnosed 77s  . Lung cancer Maternal Aunt 72       metastasized to brain and bladder  . Thyroid cancer Maternal Aunt        diagnosed 19s   Sabrina Spencer has two sons, Sabrina Spencer and Sabrina Spencer, who are 23 and 19, respectively. She has two maternal half-siblings and one niece, none of whom have had cancer.  Sabrina Spencer mother is 42 and does not have a history of cancer. She has eight maternal aunts and one maternal uncle. One aunt had bladder cancer in her 38s and was a smoker. Another aunt had  lung cancer at age 14 that spread to her brain and bladder, and was a smoker. Another aunt had thyroid cancer diagnosed in her 36s, and she was not a smoker.  Sabrina Spencer maternal uncle died from brain cancer when he was 30. Sabrina Spencer maternal grandfather had lung cancer and died in his 59s, and her maternal grandmother died in her 40s and did not have cancer, but had multiple lumps removed from her breast and bladder problems.  Sabrina Spencer father died at an unknown age. She has one paternal aunt, but does  not know anything else about her paternal side of the family.  Sabrina Spencer is unaware of previous family history of genetic testing for hereditary cancer risks. Patient's maternal ancestors are of unknown descent, and paternal ancestors are of unknown descent. There is no reported Ashkenazi Jewish ancestry. There is no known consanguinity.  GENETIC COUNSELING ASSESSMENT: Sabrina Spencer is a 48 y.o. female with a personal history of breast cancer and a family history of lung, thyroid, brain and bladder cancer, which is somewhat suggestive of a hereditary cancer syndrome and predisposition to cancer. We, therefore, discussed and recommended the following at today's visit.   DISCUSSION: We discussed that 5 - 10% of breast cancer is hereditary, with most cases associated with BRCA1/2.  There are other genes that can be associated with hereditary breast cancer syndromes.  These include ATM, CHEK2, PALB2, etc.  We discussed that testing is beneficial for several reasons including knowing about other cancer risks, identifying potential screening and risk-reduction options that may be appropriate, and to understand if other family members could be at risk for cancer and allow them to undergo genetic testing.   We reviewed the characteristics, features and inheritance patterns of hereditary cancer syndromes. We also discussed genetic testing, including the appropriate family members to test, the process of testing,  insurance coverage and turn-around-time for results. We discussed the implications of a negative, positive and/or variant of uncertain significant result. We recommended Sabrina Spencer pursue genetic testing for the [facility] CustomNext-Cancer panel + RNAinsight.  The CustomNext-Expanded gene panel offered by [facility] and includes sequencing and rearrangement analysis for the following 91 genes: ABRAXAS1 (FAM175A), AIP, ALK, APC*, ATM*, AXIN2, BAP1, BARD1, BLM, BRCA1*, BRCA2*, BRIP1*, BMPR1A, CASR, CDC73, CDH1*, CDK4, CDKN1B, CDKN2A, CFTR, CHEK2*, CPA1, CTNNA1, CTRC, DICER1, EGFR, EGLN1, EPCAM, FANCC, FH, FLCN, GALNT12, GREM1, HOXB13, KIF1B, KIT, LZTR1, MAX, MEN1, MET, MITF, MLH1*, MLH3, MRE11A, MSH2*, MSH3, MSH6*, MUTYH*, NBN, NF1*, NF2, NTHL1, PALB2*, PALLD, PDGFRA, PHOX2B, POT1, PMS2*, POLD1, POLE, PRKAR1A, PRSS1, PTCH1*, PTEN*, RAD50, RAD51C*, RAD51D*, RB1, RECQL, RET, RINT1, RPS20, SDHA, SDHAF2, SDHB, SDHC, SDHD, SMAD4, SMARCA4, SMARCB1, SMARCE1, SPINK1, STK11, SUFU, TERT, TMEM127, TP53*, TSC1, TSC2, VHL, XRCC2. DNA and RNA analyses performed for * genes.    Based on Sabrina Spencer's personal history of cancer and limited paternal family history, she meets medical criteria for genetic testing. Despite that she meets criteria, she may still have an out of pocket cost. We discussed that if her out of pocket cost for testing is over $100, the laboratory will call and confirm whether she wants to proceed with testing.  If the out of pocket cost of testing is less than $100 she will be billed by the genetic testing laboratory.   PLAN: After considering the risks, benefits, and limitations, Sabrina Spencer provided informed consent to pursue genetic testing and the blood sample was sent to [facility] for analysis of the CustomNext-Cancer panel +RNAinsight. Results should be available within approximately two-three weeks' time, at which point they will be disclosed by telephone to Sabrina Spencer, as will any additional  recommendations warranted by these results. Sabrina Spencer will receive a summary of her genetic counseling visit and a copy of her results once available. This information will also be available in Epic.   Ms. Zupko questions were answered to her satisfaction today. Our contact information was provided should additional questions or concerns arise. Thank you for the referral and allowing [place] to share in the care of your patient.   Clint Guy, MS Genetic  Counselor [email or web address] Phone: [phone]   The patient was seen for a total of 45 minutes in face-to-face genetic counseling.  This patient was discussed with Drs. Magrinat, Lindi Adie and/or Burr Medico who agrees with the above.    _______________________________________________________________________ For Office Staff:  Number of people involved in session: 1 Was an Intern/ student involved with case: no

## 2018-08-24 NOTE — Telephone Encounter (Signed)
Returned call to Sabrina Spencer. She found out that her brother has been diagnosed with melanoma in the past 6 months, and she had a maternal aunt who also had melanoma. I will update her family history with this information. We discussed risk factors for melanoma and that her genetic testing will include genes that are associated with an increased risk for melanoma.

## 2018-08-25 LAB — VITAMIN D 25 HYDROXY (VIT D DEFICIENCY, FRACTURES): Vit D, 25-Hydroxy: 101 ng/mL — ABNORMAL HIGH (ref 30.0–100.0)

## 2018-08-31 ENCOUNTER — Telehealth: Payer: Self-pay | Admitting: Genetic Counselor

## 2018-08-31 NOTE — Telephone Encounter (Signed)
Sabrina Spencer called to check on the status of her genetic testing after receiving notification of available results in MyChart. Her sample is currently being processed by the genetic testing laboratory, and the results are not yet available. I will notify Sabrina Spencer with the results once I receive them, likely in the next one to two weeks.

## 2018-09-07 ENCOUNTER — Encounter: Payer: Self-pay | Admitting: Genetic Counselor

## 2018-09-07 ENCOUNTER — Telehealth: Payer: Self-pay | Admitting: Genetic Counselor

## 2018-09-07 ENCOUNTER — Ambulatory Visit: Payer: Self-pay | Admitting: Genetic Counselor

## 2018-09-07 DIAGNOSIS — Z1379 Encounter for other screening for genetic and chromosomal anomalies: Secondary | ICD-10-CM | POA: Insufficient documentation

## 2018-09-07 NOTE — Progress Notes (Signed)
HPI:  Ms. Chandran was previously seen in the [facility] due to a personal history of breast cancer and a family history of brain, bladder, lung, and thyroid cancer, and concerns regarding a hereditary predisposition to cancer. Please refer to our prior cancer genetics clinic note for more information regarding our discussion, assessment and recommendations, at the time. Ms. Greaves recent genetic test results were disclosed to her, as were recommendations warranted by these results. These results and recommendations are discussed in more detail below.  CANCER HISTORY:  Oncology History  Malignant neoplasm of upper-outer quadrant of right breast in female, estrogen receptor positive ([place])  07/30/2017 Initial Diagnosis   Palpable nodules in the right breast: 10 o'clock position: U/S 4.1 cm mass, single abnormal lymph node: Biopsy benign, biopsy of the mass revealed IDC with micropapillary features and extravasated mucin, grade 2, EF 60%, PR 60%, Ki-67 5%, HER-2 negative ratio 1.32, MRI revealed additional satellite nodules together measured 7.5 cm, T3 N0 stage II clinical stage AJCC 8   10/13/2017 Surgery   Bilateral mastectomies: Right mastectomy: IDC grade 2, 4 cm, lymphovascular invasion is present, margins negative, 2/7 lymph nodes are positive, ER 60% moderate to week, PR 60% moderate to weak, HER-2 negative, Ki-67 5%, T2N1A stage IB Left mastectomy: Benign radial scar    10/20/2017 Cancer Staging   Staging form: Breast, AJCC 8th Edition - Pathologic: Stage IB (pT2, pN1, cM0, G2, ER+, PR+, HER2-) - Signed by Nicholas Lose, MD on 10/20/2017   10/21/2017 -  Anti-estrogen oral therapy   Letrozole 2.5 mg daily   09/06/2018 Genetic Testing   Negative genetic testing on the [facility] CustomNext-Cancer+RNAinsight panel. 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  The CustomNext-Cancer+RNAinsight panel offered by [facility]  includes sequencing and rearrangement analysis for the following 91 genes: AIP, ALK, APC*, ATM*, AXIN2, BAP1, BARD1, BLM, BMPR1A, BRCA1*, BRCA2*, BRIP1*, CDC73, CDH1*, CDK4, CDKN1B, CDKN2A, CHEK2*, CTNNA1, DICER1, FANCC, FH, FLCN, GALNT12, KIF1B, LZTR1, MAX, MEN1, MET, MLH1*, MRE11A, MSH2*, MSH3, MSH6*, MUTYH*, NBN, NF1*, NF2, NTHL1, PALB2*, PHOX2B, PMS2*, POT1, PRKAR1A, PTCH1, PTEN*, RAD50, RAD51C*, RAD51D*, RB1, RECQL, RET, SDHA, SDHAF2, SDHB, SDHC, SDHD, SMAD4, SMARCA4, SMARCB1, SMARCE1, STK11, SUFU, TMEM127, TP53*, TSC1, TSC2, VHL and XRCC2 (sequencing and deletion/duplication); CASR, CFTR, CPA1, CTRC, EGFR, EGLN1, FAM175A, HOXB13, KIT, MITF, MLH3, PALLD, PDGFRA, POLD1, POLE, PRSS1, RINT1, RPS20, SPINK1 and TERT (sequencing only); EPCAM and GREM1 (deletion/duplication only). DNA and RNA analyses performed for * genes.     FAMILY HISTORY:  We obtained a detailed, 4-generation family history.  Significant diagnoses are listed below: Family History  Problem Relation Age of Onset  . Lung cancer Maternal Grandfather        diagnosed 40s  . Brain cancer Maternal Uncle 57  . Bladder Cancer Maternal Aunt        diagnosed 22s  . Lung cancer Maternal Aunt 72       metastasized to brain and bladder  . Thyroid cancer Maternal Aunt        diagnosed 7s     Ms. Guidry has two sons, Inocente Salles and Tressie Ellis, who are 23 and 19, respectively. She has two maternal half-siblings and one niece, none of whom have had cancer.  Ms. Ruffini mother is 33 and does not have a history of cancer. She has eight maternal aunts and one maternal uncle. One aunt had bladder cancer in her 62s and was a smoker. Another aunt had lung cancer at age  72 that spread to her brain and bladder, and was a smoker. Another aunt had thyroid cancer diagnosed in her 60s, and she was not a smoker.  Ms. Pinnix maternal uncle died from brain cancer when he was 72. Ms. Dines maternal grandfather had lung cancer and died in his 75s, and her maternal  grandmother died in her 51s and did not have cancer, but had multiple lumps removed from her breast and bladder problems.  Ms. Teehan father died at an unknown age. She has one paternal aunt, but does not know anything else about her paternal side of the family.  Ms. Scripter is unaware of previous family history of genetic testing for hereditary cancer risks. Patient's maternal ancestors are of unknown descent, and paternal ancestors are of unknown descent. There is no reported Ashkenazi Jewish ancestry. There is no known consanguinity.  GENETIC TEST RESULTS: Genetic testing reported out on 09/06/2018 through the [facility] CustomNext-Cancer+RNAinsight cancer panel found no pathogenic variants. The CustomNext-Cancer+RNAinsight panel offered by [facility] includes sequencing and rearrangement analysis for the following 91 genes: AIP, ALK, APC*, ATM*, AXIN2, BAP1, BARD1, BLM, BMPR1A, BRCA1*, BRCA2*, BRIP1*, CDC73, CDH1*, CDK4, CDKN1B, CDKN2A, CHEK2*, CTNNA1, DICER1, FANCC, FH, FLCN, GALNT12, KIF1B, LZTR1, MAX, MEN1, MET, MLH1*, MRE11A, MSH2*, MSH3, MSH6*, MUTYH*, NBN, NF1*, NF2, NTHL1, PALB2*, PHOX2B, PMS2*, POT1, PRKAR1A, PTCH1, PTEN*, RAD50, RAD51C*, RAD51D*, RB1, RECQL, RET, SDHA, SDHAF2, SDHB, SDHC, SDHD, SMAD4, SMARCA4, SMARCB1, SMARCE1, STK11, SUFU, TMEM127, TP53*, TSC1, TSC2, VHL and XRCC2 (sequencing and deletion/duplication); CASR, CFTR, CPA1, CTRC, EGFR, EGLN1, FAM175A, HOXB13, KIT, MITF, MLH3, PALLD, PDGFRA, POLD1, POLE, PRSS1, RINT1, RPS20, SPINK1 and TERT (sequencing only); EPCAM and GREM1 (deletion/duplication only). DNA and RNA analyses performed for * genes. The test report will be scanned into EPIC and located under the Molecular Pathology section of the Results Review tab.  A portion of the result report is included below for reference.     We discussed with Ms. Goswick that because current genetic testing is not perfect, it is possible there may be a gene mutation in one of these genes that  current testing cannot detect, but that chance is small.  We also discussed, that there could be another gene that has not yet been discovered, or that we have not yet tested, that is responsible for the cancer diagnoses in the family. It is also possible there is a hereditary cause for the cancer in the family that Ms. Laba did not inherit and therefore was not identified in her testing.  Therefore, it is important to remain in touch with cancer genetics in the future so that we can continue to offer Ms. Bonifas the most up to date genetic testing.   Genetic testing did identify a variant of uncertain significance (VUS) was identified in the BARD1 gene called c.1390C>G (p.P454A).  At this time, it is unknown if this variant is associated with increased cancer risk or if this is a normal finding, but most variants such as this get reclassified to being inconsequential. It should not be used to make medical management decisions. With time, we suspect the lab will determine the significance of this variant, if any. If we do learn more about it, we will try to contact Ms. Rachels to discuss it further. However, it is important to stay in touch with [place] periodically and keep the address and phone number up to date.  ADDITIONAL GENETIC TESTING: We discussed with Ms. Gavigan that her genetic testing was fairly extensive.  If there are genes identified  to increase cancer risk that can be analyzed in the future, we would be happy to discuss and coordinate this testing at that time.    CANCER SCREENING RECOMMENDATIONS: Ms. Parrott test result is considered negative (normal).  This means that we have not identified a hereditary cause for her personal and family history of cancer at this time. Most cancers happen by chance and this negative test suggests that her cancer may fall into this category.    While reassuring, this does not definitively rule out a hereditary predisposition to cancer. It is still possible that there  could be genetic mutations that are undetectable by current technology. There could be genetic mutations in genes that have not been tested or identified to increase cancer risk.  Therefore, it is recommended she continue to follow the cancer management and screening guidelines provided by her oncology and primary healthcare provider.   An individual's cancer risk and medical management are not determined by genetic test results alone. Overall cancer risk assessment incorporates additional factors, including personal medical history, family history, and any available genetic information that may result in a personalized plan for cancer prevention and surveillance  RECOMMENDATIONS FOR FAMILY MEMBERS:  Individuals in this family might be at some increased risk of developing cancer, over the general population risk, simply due to the family history of cancer.  We recommended women in this family have a yearly mammogram beginning at age 24, or 74 years younger than the earliest onset of cancer, an annual clinical breast exam, and perform monthly breast self-exams. Women in this family should also have a gynecological exam as recommended by their primary provider. All family members should have a colonoscopy by age 90.  FOLLOW-UP: Lastly, we discussed with Ms. Bey that cancer genetics is a rapidly advancing field and it is possible that new genetic tests will be appropriate for her and/or her family members in the future. We encouraged her to remain in contact with cancer genetics on an annual basis so we can update her personal and family histories and let her know of advances in cancer genetics that may benefit this family.   Our contact number was provided. Ms. Sant questions were answered to her satisfaction, and she knows she is welcome to call us at anytime with additional questions or concerns.   Clint Guy, MS, [facility] Certified Genetic Counselor [email or web address] Phone: [phone]

## 2018-09-07 NOTE — Telephone Encounter (Signed)
Revealed negative genetic testing.  Discussed that we do not know why she has had breast cancer or why there is cancer in the family. It could be due to a different gene that we are not testing, or maybe our current technology may not be able to pick something up.  It will be important for her to keep in contact with genetics to keep up with whether she qualifies for additional testing in the future.   A variant of uncertain significance was detected in the BARD1 gene called c.1360C>G (p.P454A). Her results are still considered normal and this variant should not impact medical management.

## 2018-09-14 NOTE — Progress Notes (Signed)
HEMATOLOGY-ONCOLOGY DOXIMITY VISIT PROGRESS NOTE  I connected with Sabrina Spencer on 09/15/2018 at  9:15 AM EDT by Doximity video conference and verified that I am speaking with the correct person using two identifiers.  I discussed the limitations, risks, security and privacy concerns of performing an evaluation and management service by Doximity and the availability of in person appointments.  I also discussed with the patient that there may be a patient responsible charge related to this service. The patient expressed understanding and agreed to proceed.  Patient's Location: Home Physician Location: Clinic  CHIEF COMPLIANT: Follow-up of right beast cancer on letrozole  INTERVAL HISTORY: Sabrina Spencer is a 48 y.o. female with above-mentioned history of right breast cancer who underwent bilateral mastectomies, axillary lymph node dissection, and is currently on anti-estrogen therapy with letrozole. Chest CT on 12/02/2017 showed no evidence of metastatic disease and stable benign pulmonary nodules. She presents over Doximity today for follow-up.  She has experienced significant muscle aches and pains with letrozole.  Because of this she is now taking it every other day and appears to be tolerating it much better.  Oncology History  Malignant neoplasm of upper-outer quadrant of right breast in female, estrogen receptor positive ([place])  07/30/2017 Initial Diagnosis   Palpable nodules in the right breast: 10 o'clock position: U/S 4.1 cm mass, single abnormal lymph node: Biopsy benign, biopsy of the mass revealed IDC with micropapillary features and extravasated mucin, grade 2, EF 60%, PR 60%, Ki-67 5%, HER-2 negative ratio 1.32, MRI revealed additional satellite nodules together measured 7.5 cm, T3 N0 stage II clinical stage AJCC 8   10/13/2017 Surgery   Bilateral mastectomies: Right mastectomy: IDC grade 2, 4 cm, lymphovascular invasion is present, margins negative, 2/7 lymph nodes are  positive, ER 60% moderate to week, PR 60% moderate to weak, HER-2 negative, Ki-67 5%, T2N1A stage IB Left mastectomy: Benign radial scar    10/20/2017 Cancer Staging   Staging form: Breast, AJCC 8th Edition - Pathologic: Stage IB (pT2, pN1, cM0, G2, ER+, PR+, HER2-) - Signed by Nicholas Lose, MD on 10/20/2017   10/21/2017 -  Anti-estrogen oral therapy   Letrozole 2.5 mg daily   09/06/2018 Genetic Testing   Negative genetic testing on the [facility] CustomNext-Cancer+RNAinsight panel. 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  The CustomNext-Cancer+RNAinsight panel offered by [facility] includes sequencing and rearrangement analysis for the following 91 genes: AIP, ALK, APC*, ATM*, AXIN2, BAP1, BARD1, BLM, BMPR1A, BRCA1*, BRCA2*, BRIP1*, CDC73, CDH1*, CDK4, CDKN1B, CDKN2A, CHEK2*, CTNNA1, DICER1, FANCC, FH, FLCN, GALNT12, KIF1B, LZTR1, MAX, MEN1, MET, MLH1*, MRE11A, MSH2*, MSH3, MSH6*, MUTYH*, NBN, NF1*, NF2, NTHL1, PALB2*, PHOX2B, PMS2*, POT1, PRKAR1A, PTCH1, PTEN*, RAD50, RAD51C*, RAD51D*, RB1, RECQL, RET, SDHA, SDHAF2, SDHB, SDHC, SDHD, SMAD4, SMARCA4, SMARCB1, SMARCE1, STK11, SUFU, TMEM127, TP53*, TSC1, TSC2, VHL and XRCC2 (sequencing and deletion/duplication); CASR, CFTR, CPA1, CTRC, EGFR, EGLN1, FAM175A, HOXB13, KIT, MITF, MLH3, PALLD, PDGFRA, POLD1, POLE, PRSS1, RINT1, RPS20, SPINK1 and TERT (sequencing only); EPCAM and GREM1 (deletion/duplication only). DNA and RNA analyses performed for * genes.     REVIEW OF SYSTEMS:   Constitutional: Denies fevers, chills or abnormal weight loss Eyes: Denies blurriness of vision Ears, nose, mouth, throat, and face: Denies mucositis or sore throat Respiratory: Denies cough, dyspnea or wheezes Cardiovascular: Denies palpitation, chest discomfort Gastrointestinal:  Denies nausea, heartburn or change in bowel habits Skin: Denies abnormal skin rashes Lymphatics: Denies new lymphadenopathy or easy  bruising Neurological:Denies numbness, tingling or  new weaknesses Behavioral/Psych: Mood is stable, no new changes  Extremities: No lower extremity edema, muscle aches and pains Breast: s/p bilateral mastectomies All other systems were reviewed with the patient and are negative.  Observations/Objective:  There were no vitals filed for this visit. There is no height or weight on file to calculate BMI.  I have reviewed the data as listed CMP Latest Ref Rng & Units 08/24/2018 05/10/2018 12/24/2017  Glucose 70 - 99 mg/dL 101(H) 96 113(H)  BUN 6 - 20 mg/dL _0 Creatinine 0.44 - 1.00 mg/dL 0.72 0.70 0.61  Sodium 135 - 145 mmol/L 141 138 139  Potassium 3.5 - 5.1 mmol/L 5.5(H) 3.9 4.5  Chloride 98 - 111 mmol/L 107 105 106  CO2 22 - 32 mmol/L _1 Calcium 8.9 - 10.3 mg/dL 9.9 9.3 9.1  Total Protein 6.5 - 8.1 g/dL 7.5 - -  Total Bilirubin 0.3 - 1.2 mg/dL 0.4 - -  Alkaline Phos 38 - 126 U/L 62 - -  AST 15 - 41 U/L 17 - -  ALT 0 - 44 U/L 14 - -    Lab Results  Component Value Date   WBC 4.9 08/24/2018   HGB 12.3 08/24/2018   HCT 36.7 08/24/2018   MCV 98.4 08/24/2018   PLT 259 08/24/2018   NEUTROABS 2.6 08/24/2018      Assessment Plan:  Malignant neoplasm of upper-outer quadrant of right breast in female, estrogen receptor positive ([place]) 10/13/2017:Bilateral mastectomies:  Right mastectomy: IDC grade 2, 4 cm, lymphovascular invasion is present, margins negative, 2/7 lymph nodes are positive, ER 60% moderate to week, PR 60% moderate to weak, HER-2 negative, Ki-67 5%, T2N1A stage IIa Left mastectomy: Benign radial scar  CT chest abdomen and pelvis 08/28/2017:Few scattered pulmonary nodules throughout noted throughout the lungs bilaterally 5 mm or less, likely benign follow-up studies recommended 09/24/2017 bone scan and thoracic and lumbar MRI: No evidence of metastatic disease  Mammaprint: Low risk luminal typeA Based upon tumor board recommendation, we recommended that she  undergo complete axillary lymph node dissection Axillary lymph node dissection 12/25/2017: 0/11 lymph nodes  Treatment plan: 1.adjuvant radiation: Patient refused 2.resume letrozole 03/08/2018: 2.5 mg daily for 7 years. Patient took neoadjuvant letrozole therapy and tolerated it well.  Letrozole toxicities: Patient has been experiencing adverse effects to letrozole and she has been taking it every other day.  And she is doing much better.  She is not experiencing as much muscle and joint pains as she was on daily letrozole.  Breast cancer surveillance: No role of imaging because she had bilateral mastectomies. Patient plans to move to [facility] area and will be looking to find a medical oncologist at [facility].  We can assist her in transferring her care at that time.  I discussed the assessment and treatment plan with the patient. The patient was provided an opportunity to ask questions and all were answered. The patient agreed with the plan and demonstrated an understanding of the instructions. The patient was advised to call back or seek an in-person evaluation if the symptoms worsen or if the condition fails to improve as anticipated.   I provided 15 minutes of face-to-face Doximity time during this encounter.    Rulon Eisenmenger, MD 09/15/2018   I, Molly Dorshimer, am acting as scribe for Nicholas Lose, MD.  I have reviewed the above documentation for accuracy and completeness, and I agree with the above.

## 2018-09-15 ENCOUNTER — Other Ambulatory Visit: Payer: BLUE CROSS/BLUE SHIELD

## 2018-09-15 ENCOUNTER — Inpatient Hospital Stay: Payer: BC Managed Care – PPO | Attending: Hematology and Oncology | Admitting: Hematology and Oncology

## 2018-09-15 ENCOUNTER — Encounter: Payer: Self-pay | Admitting: Hematology and Oncology

## 2018-09-15 ENCOUNTER — Other Ambulatory Visit: Payer: Self-pay

## 2018-09-15 ENCOUNTER — Encounter: Payer: BLUE CROSS/BLUE SHIELD | Admitting: Genetic Counselor

## 2018-09-15 DIAGNOSIS — C50411 Malignant neoplasm of upper-outer quadrant of right female breast: Secondary | ICD-10-CM

## 2018-09-15 DIAGNOSIS — Z170 Estrogen receptor positive status [ER+]: Secondary | ICD-10-CM

## 2018-09-15 NOTE — Assessment & Plan Note (Signed)
10/13/2017:Bilateral mastectomies:  Right mastectomy: IDC grade 2, 4 cm, lymphovascular invasion is present, margins negative, 2/7 lymph nodes are positive, ER 60% moderate to week, PR 60% moderate to weak, HER-2 negative, Ki-67 5%, T2N1A stage IIa Left mastectomy: Benign radial scar  CT chest abdomen and pelvis 08/28/2017:Few scattered pulmonary nodules throughout noted throughout the lungs bilaterally 5 mm or less, likely benign follow-up studies recommended 09/24/2017 bone scan and thoracic and lumbar MRI: No evidence of metastatic disease  Mammaprint: Low risk luminal typeA Based upon tumor board recommendation, we recommended that she undergo complete axillary lymph node dissection Axillary lymph node dissection 12/25/2017: 0/11 lymph nodes  Treatment plan: 1.adjuvant radiation: Patient refused 2.resume letrozole 03/08/2018: 2.5 mg daily for 7 years. Patient took neoadjuvant letrozole therapy and tolerated it well.  Letrozole toxicities: Denies any major adverse effects to letrozole therapy. Breast cancer surveillance: No role of imaging because she had bilateral mastectomies. Return to clinic in  1 year for follow-up

## 2018-09-16 ENCOUNTER — Encounter: Payer: Self-pay | Admitting: *Deleted

## 2018-09-17 ENCOUNTER — Other Ambulatory Visit: Payer: Self-pay | Admitting: Plastic Surgery

## 2018-09-22 ENCOUNTER — Other Ambulatory Visit: Payer: Self-pay | Admitting: Emergency Medicine

## 2018-09-22 DIAGNOSIS — Z20822 Contact with and (suspected) exposure to covid-19: Secondary | ICD-10-CM

## 2018-09-23 LAB — NOVEL CORONAVIRUS, NAA: SARS-CoV-2, NAA: NOT DETECTED

## 2018-11-01 ENCOUNTER — Other Ambulatory Visit: Payer: Self-pay | Admitting: Hematology and Oncology

## 2018-12-06 ENCOUNTER — Telehealth: Payer: Self-pay | Admitting: *Deleted

## 2018-12-06 ENCOUNTER — Ambulatory Visit (HOSPITAL_BASED_OUTPATIENT_CLINIC_OR_DEPARTMENT_OTHER): Payer: BC Managed Care – PPO | Admitting: Hematology and Oncology

## 2018-12-06 DIAGNOSIS — C50411 Malignant neoplasm of upper-outer quadrant of right female breast: Secondary | ICD-10-CM | POA: Diagnosis not present

## 2018-12-06 DIAGNOSIS — Z170 Estrogen receptor positive status [ER+]: Secondary | ICD-10-CM

## 2018-12-06 NOTE — Telephone Encounter (Signed)
Received VM from pt, attempt x1 no answer, LVM to return call to the office.

## 2018-12-06 NOTE — Progress Notes (Signed)
Doximity video visit: I verified the patient's information using 2 identifiers.  Patient Care Team: Avon Gully, NP as PCP - General (Obstetrics and Gynecology)  DIAGNOSIS:  Encounter Diagnosis  Name Primary?  . Malignant neoplasm of upper-outer quadrant of right breast in female, estrogen receptor positive ([place])     SUMMARY OF ONCOLOGIC HISTORY: Oncology History  Malignant neoplasm of upper-outer quadrant of right breast in female, estrogen receptor positive ([place])  07/30/2017 Initial Diagnosis   Palpable nodules in the right breast: 10 o'clock position: U/S 4.1 cm mass, single abnormal lymph node: Biopsy benign, biopsy of the mass revealed IDC with micropapillary features and extravasated mucin, grade 2, EF 60%, PR 60%, Ki-67 5%, HER-2 negative ratio 1.32, MRI revealed additional satellite nodules together measured 7.5 cm, T3 N0 stage II clinical stage AJCC 8   10/13/2017 Surgery   Bilateral mastectomies: Right mastectomy: IDC grade 2, 4 cm, lymphovascular invasion is present, margins negative, 2/7 lymph nodes are positive, ER 60% moderate to week, PR 60% moderate to weak, HER-2 negative, Ki-67 5%, T2N1A stage IB Left mastectomy: Benign radial scar    10/20/2017 Cancer Staging   Staging form: Breast, AJCC 8th Edition - Pathologic: Stage IB (pT2, pN1, cM0, G2, ER+, PR+, HER2-) - Signed by Nicholas Lose, MD on 10/20/2017   10/21/2017 -  Anti-estrogen oral therapy   Letrozole 2.5 mg daily   09/06/2018 Genetic Testing   Negative genetic testing on the [facility] CustomNext-Cancer+RNAinsight panel. 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  The CustomNext-Cancer+RNAinsight panel offered by [facility] includes sequencing and rearrangement analysis for the following 91 genes: AIP, ALK, APC*, ATM*, AXIN2, BAP1, BARD1, BLM, BMPR1A, BRCA1*, BRCA2*, BRIP1*, CDC73, CDH1*, CDK4, CDKN1B, CDKN2A, CHEK2*, CTNNA1, DICER1, FANCC, FH, FLCN,  GALNT12, KIF1B, LZTR1, MAX, MEN1, MET, MLH1*, MRE11A, MSH2*, MSH3, MSH6*, MUTYH*, NBN, NF1*, NF2, NTHL1, PALB2*, PHOX2B, PMS2*, POT1, PRKAR1A, PTCH1, PTEN*, RAD50, RAD51C*, RAD51D*, RB1, RECQL, RET, SDHA, SDHAF2, SDHB, SDHC, SDHD, SMAD4, SMARCA4, SMARCB1, SMARCE1, STK11, SUFU, TMEM127, TP53*, TSC1, TSC2, VHL and XRCC2 (sequencing and deletion/duplication); CASR, CFTR, CPA1, CTRC, EGFR, EGLN1, FAM175A, HOXB13, KIT, MITF, MLH3, PALLD, PDGFRA, POLD1, POLE, PRSS1, RINT1, RPS20, SPINK1 and TERT (sequencing only); EPCAM and GREM1 (deletion/duplication only). DNA and RNA analyses performed for * genes.     CHIEF COMPLIANT: Follow-up to discuss adjuvant treatment plan  INTERVAL HISTORY: Yaneli Keithley is a 48 year old who moved to [facility].  She was on adjuvant letrozole therapy.  Her primary medical oncologist in [facility] determined that she needs to be tamoxifen and not letrozole.  Letrozole was discontinued and she was started on 20 mg of tamoxifen.  Patient had severe adverse effects to tamoxifen therapy and she stopped it.  She was very confused about by her medical oncologist was not recommending letrozole therapy.  She wanted to find out from as as to why we use letrozole instead of tamoxifen. Prior to starting letrozole therapy, patient informed me that her gynecologist had confirmed that she was in menopause.  Because of uterine ablation she has not had menstrual cycle since age 17.  REVIEW OF SYSTEMS:   Constitutional: Denies fevers, chills or abnormal weight loss Eyes: Denies blurriness of vision Ears, nose, mouth, throat, and face: Denies mucositis or sore throat Respiratory: Denies cough, dyspnea or wheezes Cardiovascular: Denies palpitation, chest discomfort Gastrointestinal:  Denies nausea, heartburn or change in bowel habits Skin: Denies abnormal skin rashes Lymphatics: Denies new lymphadenopathy or easy bruising Neurological:Denies  numbness, tingling or new  weaknesses Behavioral/Psych: Mood is stable, no new changes  Extremities: No lower extremity edema Breast:  denies any pain or lumps or nodules in either breasts All other systems were reviewed with the patient and are negative.  I have reviewed the past medical history, past surgical history, social history and family history with the patient and they are unchanged from previous note.  ALLERGIES:  has No Known Allergies.  MEDICATIONS:  Current Outpatient Medications  Medication Sig Dispense Refill  . clindamycin (CLEOCIN) 300 MG capsule Take 500 mg by mouth 4 (four) times daily.    Marland Kitchen HYDROcodone-acetaminophen (NORCO) 5-325 MG tablet Take 1-2 tablets by mouth every 6 (six) hours as needed for moderate pain or severe pain. (Patient not taking: Reported on 05/07/2018) 20 tablet 0  . letrozole (FEMARA) 2.5 MG tablet TAKE 1 TABLET BY MOUTH EVERY DAY 90 tablet 0   No current facility-administered medications for this visit.     PHYSICAL EXAMINATION: ECOG PERFORMANCE STATUS: 1 - Symptomatic but completely ambulatory  LABORATORY DATA:  I have reviewed the data as listed CMP Latest Ref Rng & Units 08/24/2018 05/10/2018 12/24/2017  Glucose 70 - 99 mg/dL 101(H) 96 113(H)  BUN 6 - 20 mg/dL _0 Creatinine 0.44 - 1.00 mg/dL 0.72 0.70 0.61  Sodium 135 - 145 mmol/L 141 138 139  Potassium 3.5 - 5.1 mmol/L 5.5(H) 3.9 4.5  Chloride 98 - 111 mmol/L 107 105 106  CO2 22 - 32 mmol/L _1 Calcium 8.9 - 10.3 mg/dL 9.9 9.3 9.1  Total Protein 6.5 - 8.1 g/dL 7.5 - -  Total Bilirubin 0.3 - 1.2 mg/dL 0.4 - -  Alkaline Phos 38 - 126 U/L 62 - -  AST 15 - 41 U/L 17 - -  ALT 0 - 44 U/L 14 - -    Lab Results  Component Value Date   WBC 4.9 08/24/2018   HGB 12.3 08/24/2018   HCT 36.7 08/24/2018   MCV 98.4 08/24/2018   PLT 259 08/24/2018   NEUTROABS 2.6 08/24/2018    ASSESSMENT & PLAN:  Malignant neoplasm of upper-outer quadrant of right breast in female, estrogen receptor positive  ([place]) 10/13/2017:Bilateral mastectomies:  Right mastectomy: IDC grade 2, 4 cm, lymphovascular invasion is present, margins negative, 2/7 lymph nodes are positive, ER 60% moderate to week, PR 60% moderate to weak, HER-2 negative, Ki-67 5%, T2N1A stage IIa Left mastectomy: Benign radial scar Mammaprint: Low risk luminal typeA Axillary lymph node dissection: 0/11 lymph nodes  Treatment plan: 1.adjuvant radiation: Patient refused 2. Antiestrogen therapy: Letrozole 03/08/2018: 2.5 mg daily for 7 years. Patient took neoadjuvant letrozole therapy and tolerated it well. Patient moved to [facility] and started seeing a medical oncologist there.  Her letrozole was discontinued and she was placed on tamoxifen which she did not tolerate it well.  Discussion: Prior to starting letrozole, patient had blood work with her gynecologist.  I was informed that she was in menopause and therefore we used letrozole instead of tamoxifen.  I instructed the patient to follow her medical oncologist recommendation for antiestrogen therapy.  Basically there are 3 options because of intolerance to tamoxifen. 1.  Oophorectomy with letrozole 2.  Zoladex with letrozole 3.  Restart tamoxifen at a low dose and titrated it upwards.  Patient will discuss these options with her oncologist at [place] [facility].    No orders of the defined types were placed in this encounter.  The patient  has a good understanding of the overall plan. she agrees with it. she will call with any problems that may develop before the next visit here.   Harriette Ohara, MD 12/06/18

## 2018-12-06 NOTE — Telephone Encounter (Signed)
Received call from pt stating she has moved to [facility] and established care with an oncologist in her area.  Pt states her current Oncologist has switched her from Letrozole to Tamoxifen.  Pt requesting a virtual apt with Dr. Lindi Adie for his opinion on the switch and would like to know why Letrozole was prescribed instead of Tamoxifen. Virtual apt made and pt verbalized understanding.

## 2018-12-06 NOTE — Assessment & Plan Note (Signed)
10/13/2017:Bilateral mastectomies:  Right mastectomy: IDC grade 2, 4 cm, lymphovascular invasion is present, margins negative, 2/7 lymph nodes are positive, ER 60% moderate to week, PR 60% moderate to weak, HER-2 negative, Ki-67 5%, T2N1A stage IIa Left mastectomy: Benign radial scar Mammaprint: Low risk luminal typeA Axillary lymph node dissection: 0/11 lymph nodes  Treatment plan: 1.adjuvant radiation: Patient refused 2. Antiestrogen therapy: Letrozole 03/08/2018: 2.5 mg daily for 7 years. Patient took neoadjuvant letrozole therapy and tolerated it well. Patient moved to [place] and started seeing a medical oncologist there.  Her letrozole was discontinued and she was placed on tamoxifen which she did not tolerate it well.  Discussion: Prior to starting letrozole, patient had blood work with her gynecologist.  I was informed that she was in menopause and therefore we used letrozole instead of tamoxifen.  I instructed the patient to follow her medical oncologist recommendation for antiestrogen therapy.  Basically there are 3 options because of intolerance to tamoxifen. 1.  Oophorectomy with letrozole 2.  Zoladex with letrozole 3.  Restart tamoxifen at a low dose and titrated it upwards.  Patient will discuss these options with her oncologist. 

## 2019-01-05 ENCOUNTER — Ambulatory Visit
Admission: RE | Admit: 2019-01-05 | Payer: BC Managed Care – PPO | Source: Ambulatory Visit | Admitting: Radiation Oncology

## 2019-01-05 ENCOUNTER — Telehealth: Payer: Self-pay | Admitting: *Deleted

## 2019-01-05 NOTE — Telephone Encounter (Signed)
Pt call cancel appt 01/05/19, has moved to [place]

## 2019-04-27 ENCOUNTER — Telehealth: Payer: Self-pay

## 2019-04-27 NOTE — Telephone Encounter (Signed)
RN returned call to [facility], regarding a request for Imaging Records.   No answer, no option to leave message.  RN placed call to [phone].

## 2019-06-11 IMAGING — DX DG CHEST 2V
2 series · 2 of 2 positions shown · non-contrast
Comparison: None.

CLINICAL DATA: Patient status post axillary lymph node biopsy
yesterday. Chest pain and shortness of breath since the procedure.

EXAM:
CHEST - 2 VIEW

[w chest pa]
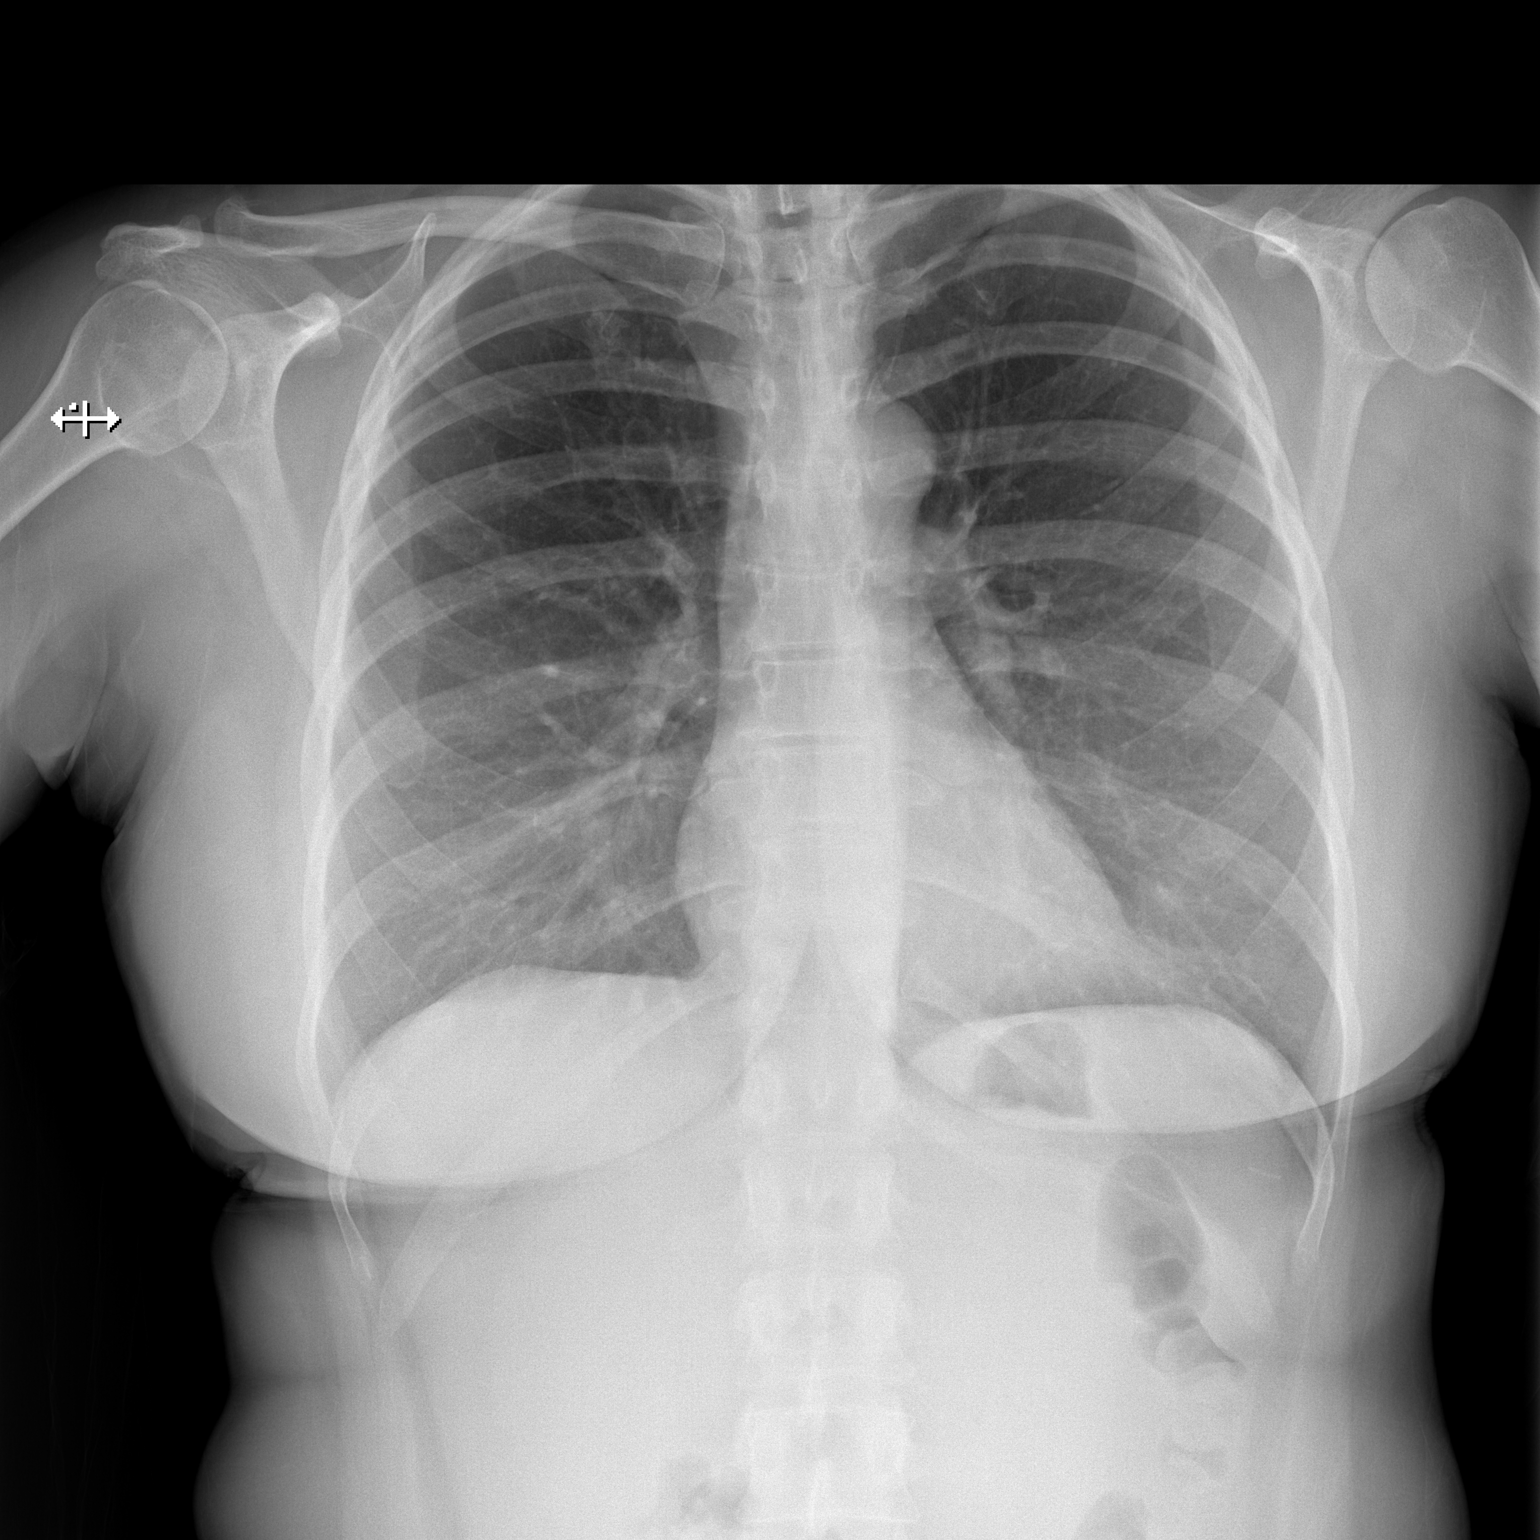

[w chest lat]
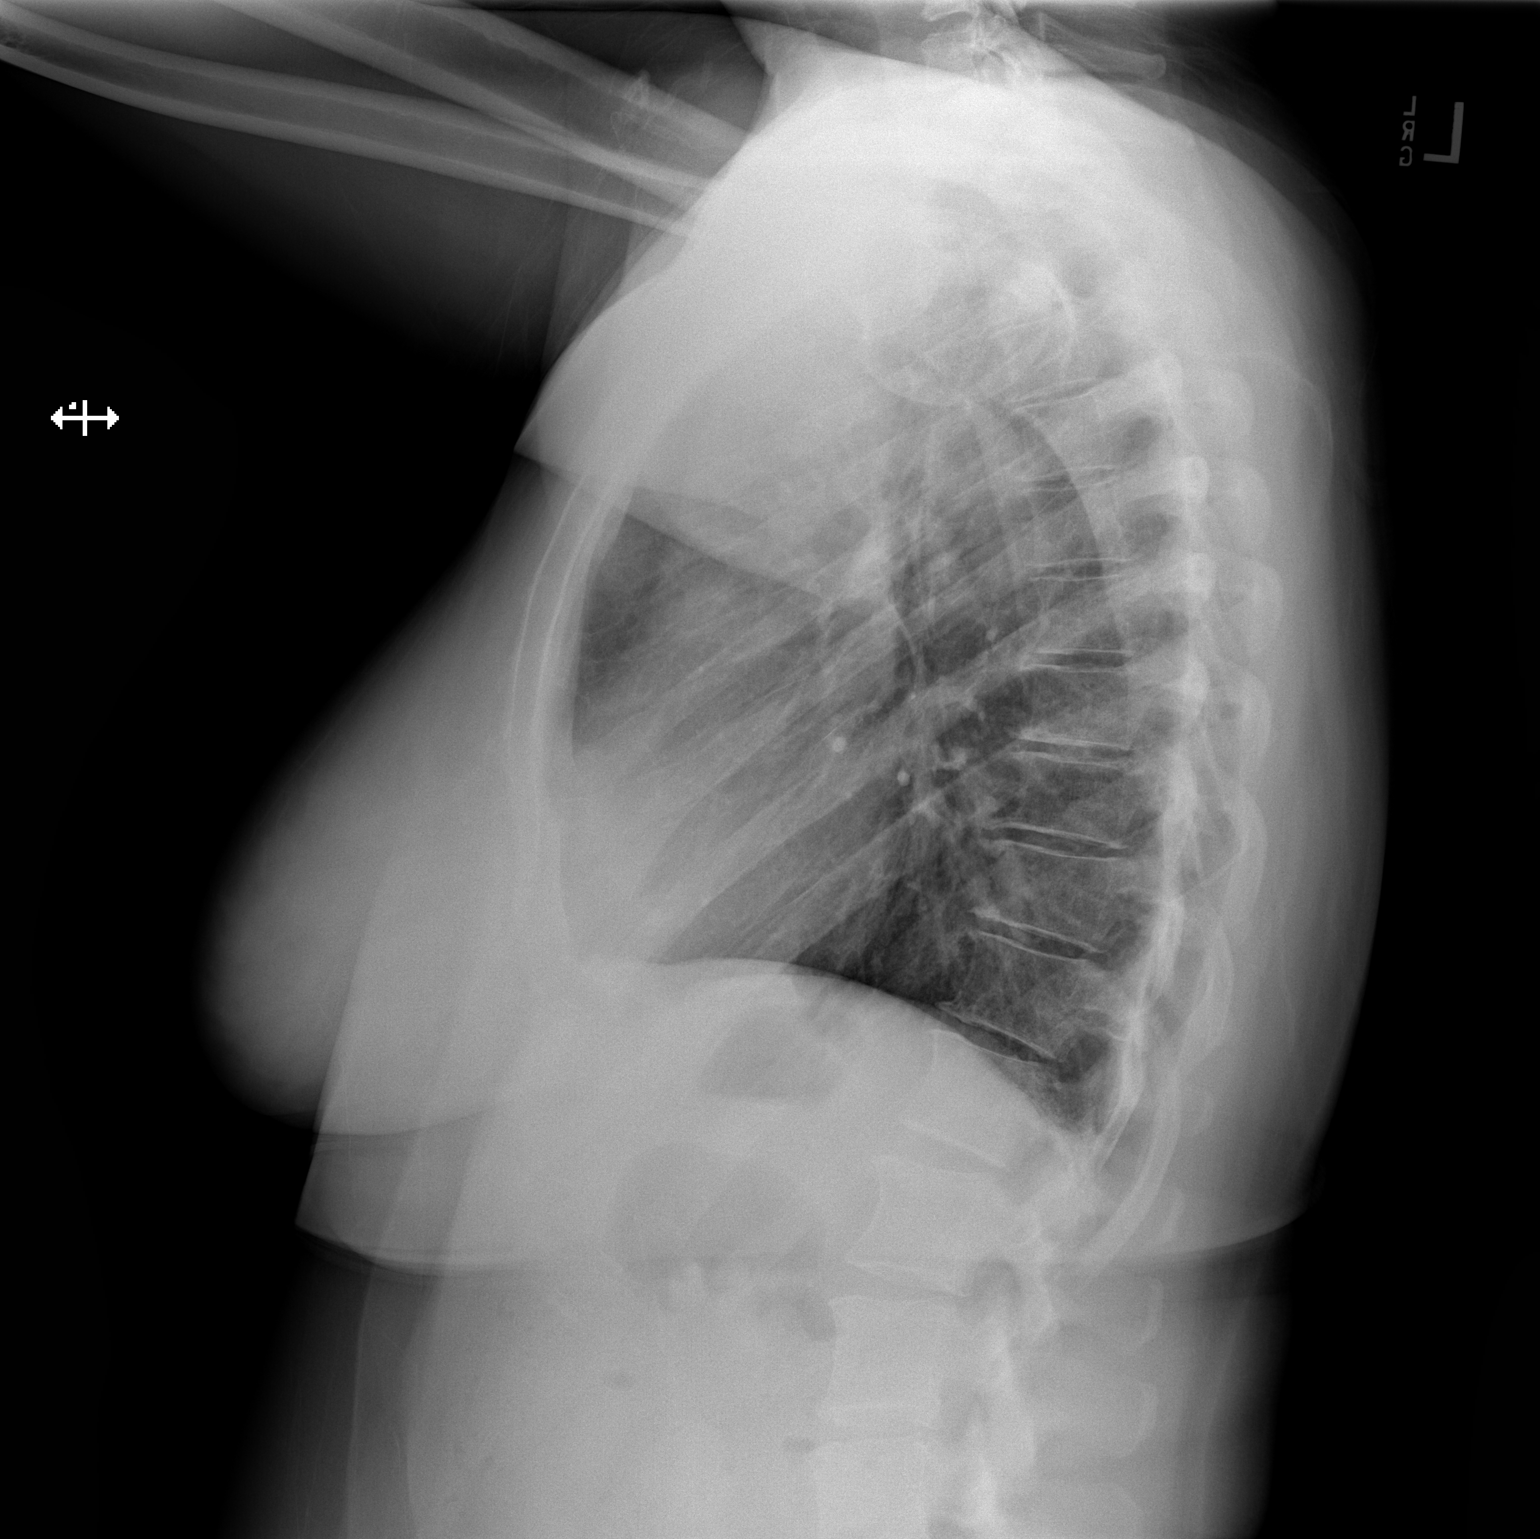

[2 of 2 positions shown; findings below may reference images not displayed]

FINDINGS: The lungs are clear. Heart size is normal. No pneumothorax or
pleural effusion. No bony abnormality. No radiopaque foreign body.
IMPRESSION: Normal chest.

## 2019-07-09 IMAGING — CT CT CHEST W/ CM
2 of 5 series · 13 of 36 positions shown, 16 images · IV contrast (OMNIPAQUE)
Comparison: No priors.

CLINICAL DATA: 47-year-old female recently diagnosed with
right-sided breast cancer. Right shoulder and neck pain.

EXAM:
CT CHEST, ABDOMEN, AND PELVIS WITH CONTRAST
TECHNIQUE: Multidetector CT imaging of the chest, abdomen and pelvis was
performed following the standard protocol during bolus
administration of intravenous contrast.
CONTRAST:  100mL OMNIPAQUE IOHEXOL 300 MG/ML  SOLN

[Series 2: cap with · axial · 0.74mm/px · z∈[-609,-104]mm · 10 of 125 slices shown, 13 images]
[im 12/125  mediastinal]
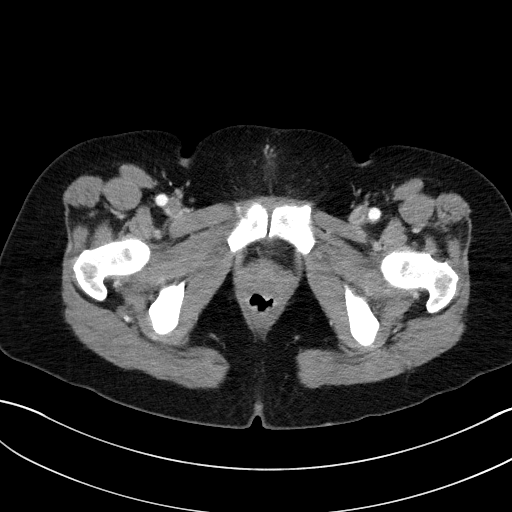
[im 12/125  lung]
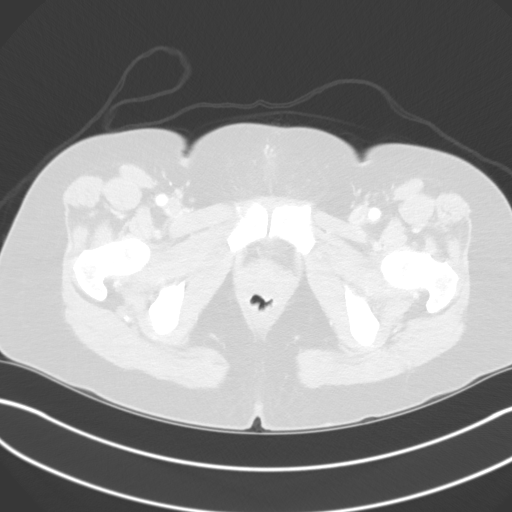
[im 23/125  lung]
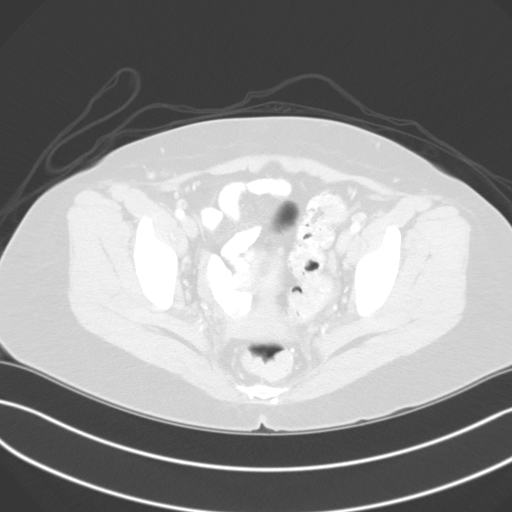
[im 34/125  lung]
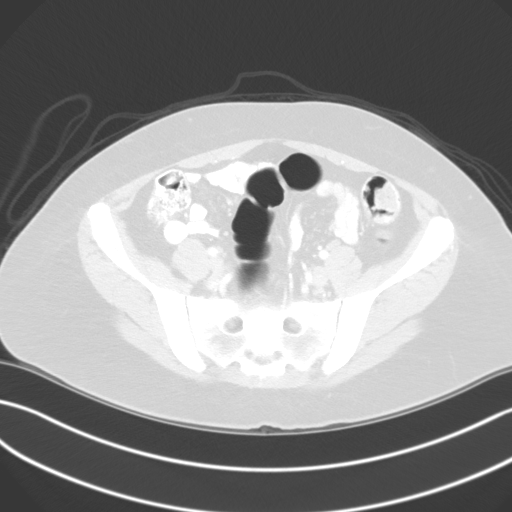
[im 46/125  lung]
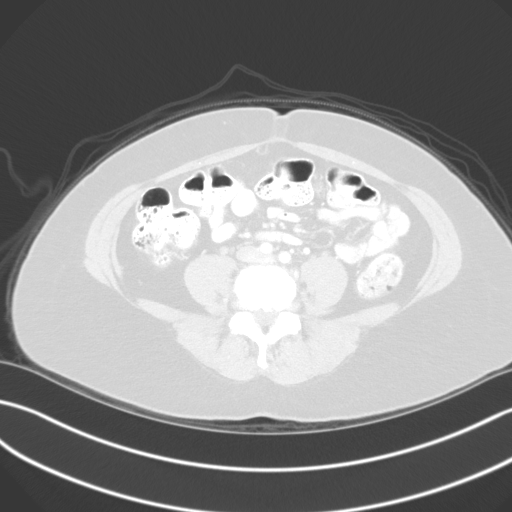
[im 57/125  mediastinal]
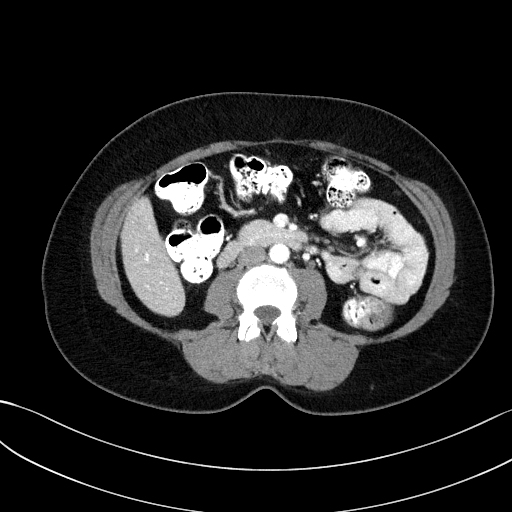
[im 57/125  lung]
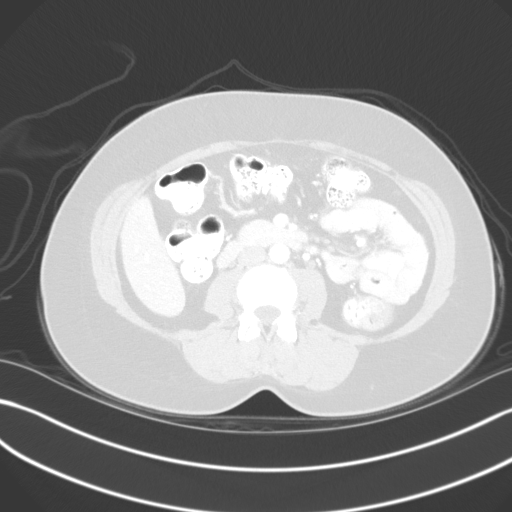
[im 68/125  lung]
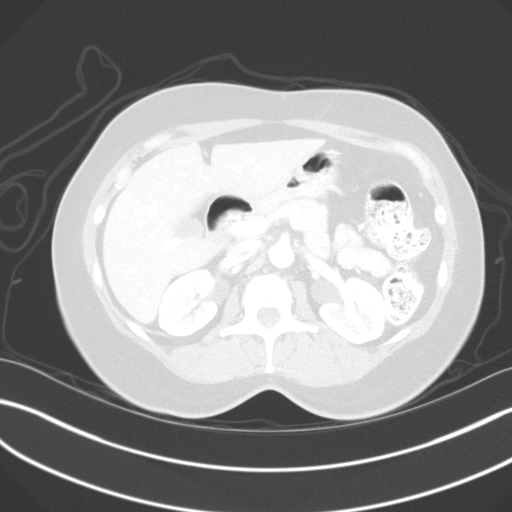
[im 79/125  lung]
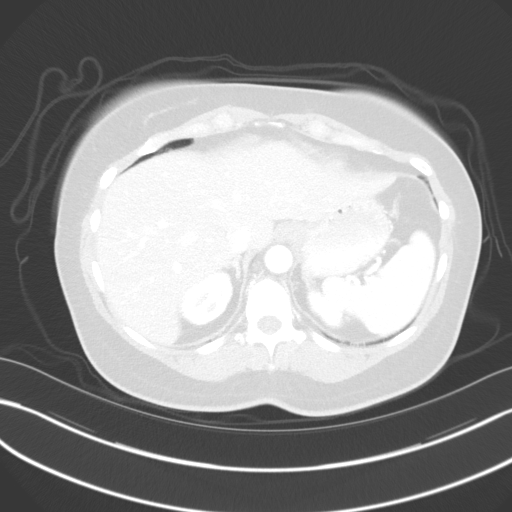
[im 91/125  lung]
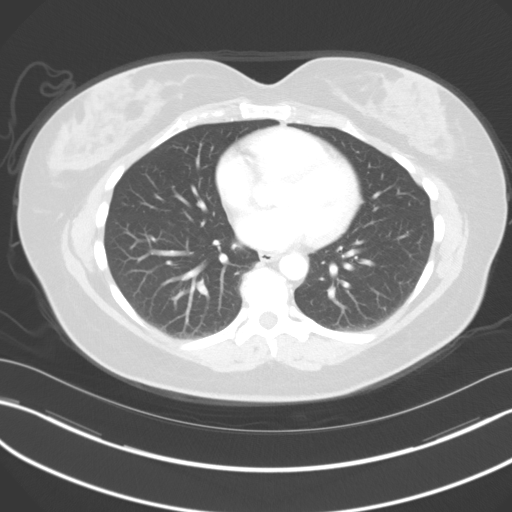
[im 102/125  mediastinal]
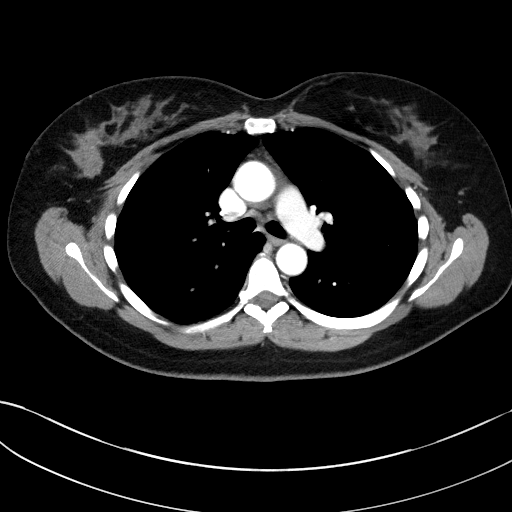
[im 102/125  lung]
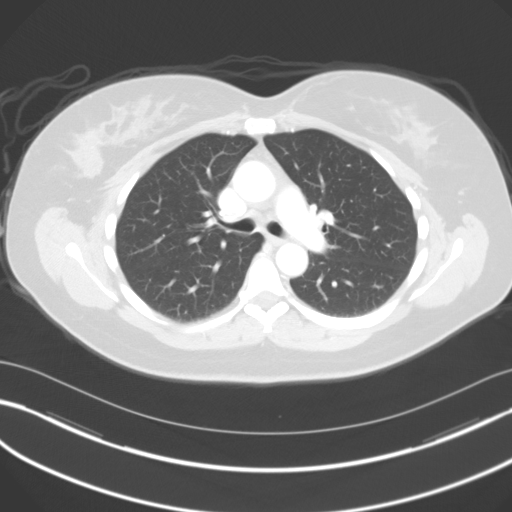
[im 113/125  lung]
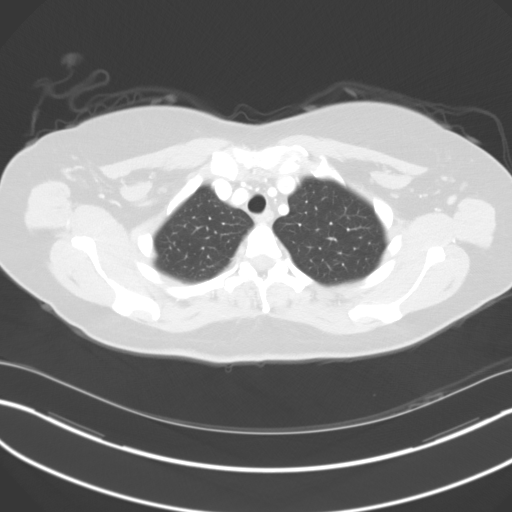

[Series 5: coronals · coronal · 0.82mm/px · 3 of 145 slices shown]
[im 29/145  lung]
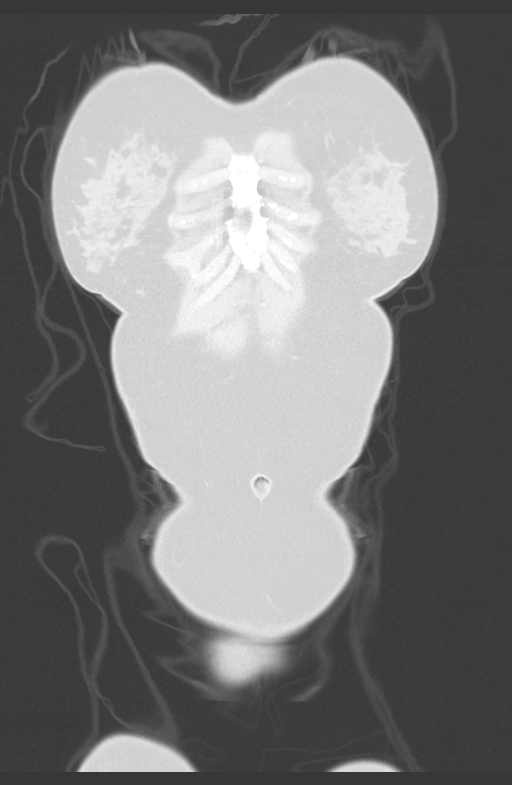
[im 58/145  lung]
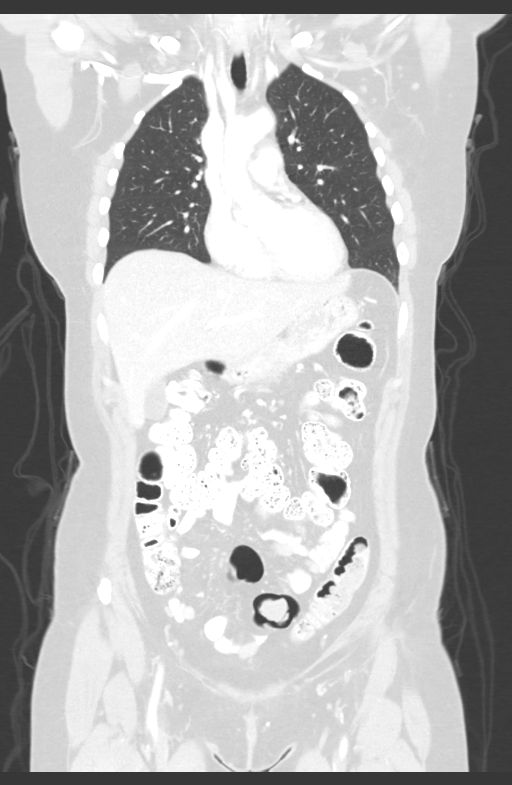
[im 87/145  lung]
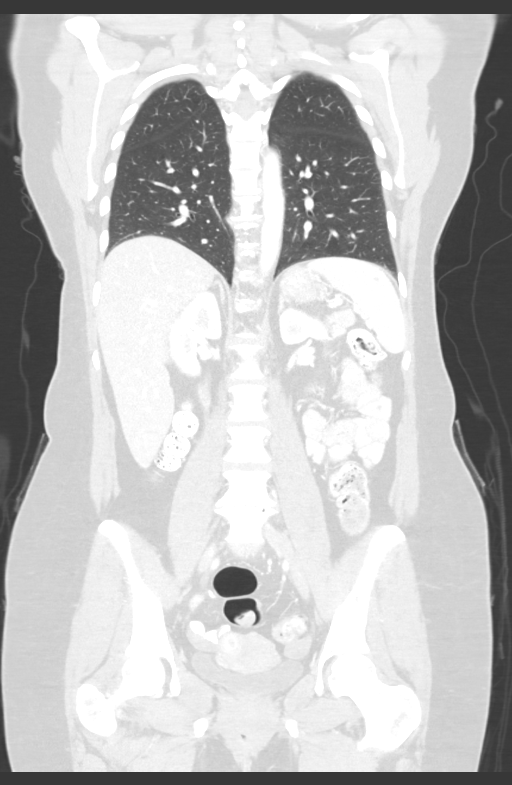

[13 of 36 positions shown; findings below may reference images not displayed]

FINDINGS: CT CHEST FINDINGS

Cardiovascular: Heart size is normal. There is no significant
pericardial fluid, thickening or pericardial calcification. No
atherosclerotic disease noted in the thoracic aorta. No coronary
artery calcifications.

Mediastinum/Nodes: No pathologically enlarged mediastinal, hilar or
internal mammary lymph nodes. Esophagus is unremarkable in
appearance. No axillary lymphadenopathy.

Lungs/Pleura: A few scattered pulmonary nodules are noted throughout
the lungs bilaterally, 5 mm or less in size. The largest of these
nodules is a subpleural lesion in the posterior aspect of the right
lower lobe (axial image 72 of series 4) measuring 6 x 4 mm (mean
diameter 5 mm). No larger more suspicious appearing pulmonary
nodules or masses are noted. No acute consolidative airspace
disease. No pleural effusions.

Musculoskeletal: There are no aggressive appearing lytic or blastic
lesions noted in the visualized portions of the skeleton.

CT ABDOMEN PELVIS FINDINGS

Hepatobiliary: No suspicious cystic or solid hepatic lesions. No
intra or extrahepatic biliary ductal dilatation. Gallbladder is
normal in appearance.

Pancreas: No pancreatic mass. No pancreatic ductal dilatation. No
pancreatic or peripancreatic fluid or inflammatory changes.

Spleen: Unremarkable.

Adrenals/Urinary Tract: Bilateral kidneys and bilateral adrenal
glands are normal in appearance. No hydroureteronephrosis. Urinary
bladder is normal in appearance.

Stomach/Bowel: Normal appearance of the stomach. No pathologic
dilatation of small bowel or colon. Normal appendix.

Vascular/Lymphatic: No significant atherosclerotic disease, aneurysm
or dissection noted in the abdominal or pelvic vasculature. No
lymphadenopathy noted in the abdomen or pelvis.

Reproductive: In the right side of the uterine body there is a
heterogeneously enhancing lesion measuring 1.3 cm in diameter, most
compatible with a small fibroid. Small calcification in the fundal
portion of the endometrial canal. Ovaries are unremarkable in
appearance.

Other: No significant volume of ascites.  No pneumoperitoneum.

Musculoskeletal: There are no aggressive appearing lytic or blastic
lesions noted in the visualized portions of the skeleton.
IMPRESSION: 1. While there are a few scattered tiny pulmonary nodules measuring
5 mm or less in size, these are statistically likely benign, the
majority of which are strongly favored to represent tiny subpleural
lymph nodes (a benign finding). Attention on follow-up studies is
recommended to ensure the stability or resolution of these findings.
2. No other findings suggestive of potential metastatic disease
elsewhere in the chest, abdomen or pelvis.
3. Additional incidental findings, as above.
